# Patient Record
Sex: Male | Born: 1935 | Race: White | Hispanic: No | Marital: Married | Smoking: Former smoker
Health system: Southern US, Community
[De-identification: ages and names within clinical notes are randomized; demographics above are authoritative.]

## PROBLEM LIST (undated history)

## (undated) DIAGNOSIS — I2510 Atherosclerotic heart disease of native coronary artery without angina pectoris: Secondary | ICD-10-CM

## (undated) DIAGNOSIS — I10 Essential (primary) hypertension: Secondary | ICD-10-CM

## (undated) DIAGNOSIS — I739 Peripheral vascular disease, unspecified: Secondary | ICD-10-CM

## (undated) DIAGNOSIS — K219 Gastro-esophageal reflux disease without esophagitis: Secondary | ICD-10-CM

## (undated) DIAGNOSIS — N529 Male erectile dysfunction, unspecified: Secondary | ICD-10-CM

## (undated) DIAGNOSIS — R001 Bradycardia, unspecified: Secondary | ICD-10-CM

## (undated) HISTORY — DX: Gastro-esophageal reflux disease without esophagitis: K21.9

## (undated) HISTORY — DX: Atherosclerotic heart disease of native coronary artery without angina pectoris: I25.10

## (undated) HISTORY — PX: OTHER SURGICAL HISTORY: SHX169

## (undated) HISTORY — DX: Essential (primary) hypertension: I10

## (undated) HISTORY — DX: Peripheral vascular disease, unspecified: I73.9

## (undated) HISTORY — DX: Male erectile dysfunction, unspecified: N52.9

## (undated) HISTORY — DX: Bradycardia, unspecified: R00.1

---

## 2001-08-07 ENCOUNTER — Ambulatory Visit (HOSPITAL_COMMUNITY): Admission: RE | Admit: 2001-08-07 | Discharge: 2001-08-07 | Payer: Self-pay | Admitting: Cardiology

## 2001-08-07 ENCOUNTER — Encounter: Payer: Self-pay | Admitting: Cardiology

## 2004-07-04 ENCOUNTER — Ambulatory Visit: Payer: Self-pay

## 2004-08-09 ENCOUNTER — Ambulatory Visit: Payer: Self-pay | Admitting: Internal Medicine

## 2004-09-04 ENCOUNTER — Ambulatory Visit: Payer: Self-pay | Admitting: Internal Medicine

## 2004-10-16 ENCOUNTER — Ambulatory Visit: Payer: Self-pay | Admitting: Internal Medicine

## 2004-11-22 ENCOUNTER — Ambulatory Visit: Payer: Self-pay | Admitting: Internal Medicine

## 2004-12-07 ENCOUNTER — Ambulatory Visit: Payer: Self-pay | Admitting: Specialist

## 2005-01-01 ENCOUNTER — Ambulatory Visit: Payer: Self-pay | Admitting: Internal Medicine

## 2005-02-13 ENCOUNTER — Ambulatory Visit: Payer: Self-pay | Admitting: Internal Medicine

## 2005-03-21 ENCOUNTER — Ambulatory Visit: Payer: Self-pay | Admitting: Internal Medicine

## 2005-04-24 ENCOUNTER — Ambulatory Visit: Payer: Self-pay | Admitting: Internal Medicine

## 2005-05-25 ENCOUNTER — Ambulatory Visit: Payer: Self-pay | Admitting: Internal Medicine

## 2005-06-27 ENCOUNTER — Ambulatory Visit: Payer: Self-pay | Admitting: Internal Medicine

## 2005-07-30 ENCOUNTER — Ambulatory Visit: Payer: Self-pay | Admitting: Internal Medicine

## 2005-08-30 ENCOUNTER — Ambulatory Visit: Payer: Self-pay | Admitting: Internal Medicine

## 2005-11-01 ENCOUNTER — Ambulatory Visit: Payer: Self-pay | Admitting: Surgery

## 2005-11-07 ENCOUNTER — Ambulatory Visit: Payer: Self-pay | Admitting: Surgery

## 2005-11-14 ENCOUNTER — Ambulatory Visit: Payer: Self-pay | Admitting: Surgery

## 2005-11-21 ENCOUNTER — Ambulatory Visit: Payer: Self-pay | Admitting: Internal Medicine

## 2005-11-28 ENCOUNTER — Ambulatory Visit: Payer: Self-pay | Admitting: Internal Medicine

## 2005-12-04 ENCOUNTER — Ambulatory Visit (HOSPITAL_COMMUNITY): Admission: RE | Admit: 2005-12-04 | Discharge: 2005-12-04 | Payer: Self-pay | Admitting: Internal Medicine

## 2005-12-04 ENCOUNTER — Ambulatory Visit: Payer: Self-pay | Admitting: Internal Medicine

## 2005-12-04 HISTORY — PX: PACEMAKER INSERTION: SHX728

## 2005-12-19 ENCOUNTER — Ambulatory Visit: Payer: Self-pay

## 2006-01-07 ENCOUNTER — Ambulatory Visit: Payer: Self-pay | Admitting: Internal Medicine

## 2006-02-06 ENCOUNTER — Ambulatory Visit: Payer: Self-pay | Admitting: Internal Medicine

## 2006-04-16 ENCOUNTER — Ambulatory Visit: Payer: Self-pay | Admitting: Ophthalmology

## 2006-04-23 ENCOUNTER — Ambulatory Visit: Payer: Self-pay | Admitting: Ophthalmology

## 2006-04-29 ENCOUNTER — Ambulatory Visit: Payer: Self-pay | Admitting: Internal Medicine

## 2006-05-01 ENCOUNTER — Ambulatory Visit: Payer: Self-pay | Admitting: Ophthalmology

## 2006-07-22 ENCOUNTER — Ambulatory Visit: Payer: Self-pay | Admitting: Internal Medicine

## 2006-10-14 ENCOUNTER — Ambulatory Visit: Payer: Self-pay | Admitting: Internal Medicine

## 2007-01-06 ENCOUNTER — Ambulatory Visit: Payer: Self-pay | Admitting: Internal Medicine

## 2007-02-17 ENCOUNTER — Ambulatory Visit: Payer: Self-pay

## 2007-03-11 ENCOUNTER — Ambulatory Visit: Payer: Self-pay | Admitting: Internal Medicine

## 2007-03-31 ENCOUNTER — Ambulatory Visit: Payer: Self-pay | Admitting: Internal Medicine

## 2007-06-23 ENCOUNTER — Ambulatory Visit: Payer: Self-pay | Admitting: Internal Medicine

## 2007-07-03 HISTORY — PX: OTHER SURGICAL HISTORY: SHX169

## 2007-07-24 ENCOUNTER — Ambulatory Visit: Payer: Self-pay | Admitting: Internal Medicine

## 2007-08-12 ENCOUNTER — Ambulatory Visit: Payer: Self-pay | Admitting: Ophthalmology

## 2007-09-11 ENCOUNTER — Ambulatory Visit: Payer: Self-pay

## 2007-10-07 ENCOUNTER — Ambulatory Visit: Payer: Self-pay | Admitting: Ophthalmology

## 2007-10-07 ENCOUNTER — Other Ambulatory Visit: Payer: Self-pay

## 2007-10-14 ENCOUNTER — Ambulatory Visit: Payer: Self-pay | Admitting: Ophthalmology

## 2007-10-23 ENCOUNTER — Ambulatory Visit: Payer: Self-pay | Admitting: Internal Medicine

## 2007-12-15 ENCOUNTER — Ambulatory Visit: Payer: Self-pay | Admitting: Gastroenterology

## 2008-01-22 ENCOUNTER — Ambulatory Visit: Payer: Self-pay | Admitting: Internal Medicine

## 2008-02-17 ENCOUNTER — Other Ambulatory Visit: Payer: Self-pay

## 2008-02-17 ENCOUNTER — Inpatient Hospital Stay: Payer: Self-pay | Admitting: Vascular Surgery

## 2008-02-20 ENCOUNTER — Ambulatory Visit: Payer: Self-pay | Admitting: Internal Medicine

## 2008-04-05 ENCOUNTER — Ambulatory Visit: Payer: Self-pay | Admitting: Internal Medicine

## 2008-04-22 ENCOUNTER — Ambulatory Visit: Payer: Self-pay | Admitting: Internal Medicine

## 2008-04-26 ENCOUNTER — Ambulatory Visit: Payer: Self-pay | Admitting: Gastroenterology

## 2008-07-02 HISTORY — PX: CORONARY ARTERY BYPASS GRAFT: SHX141

## 2008-07-22 ENCOUNTER — Ambulatory Visit: Payer: Self-pay | Admitting: Internal Medicine

## 2008-09-22 ENCOUNTER — Ambulatory Visit: Payer: Self-pay | Admitting: Orthopedic Surgery

## 2008-10-13 ENCOUNTER — Ambulatory Visit: Payer: Self-pay | Admitting: Vascular Surgery

## 2008-10-14 ENCOUNTER — Encounter: Payer: Self-pay | Admitting: Internal Medicine

## 2008-10-21 ENCOUNTER — Ambulatory Visit: Payer: Self-pay | Admitting: Internal Medicine

## 2008-11-08 ENCOUNTER — Ambulatory Visit: Payer: Self-pay | Admitting: Vascular Surgery

## 2008-11-20 DIAGNOSIS — I10 Essential (primary) hypertension: Secondary | ICD-10-CM

## 2008-11-20 DIAGNOSIS — I498 Other specified cardiac arrhythmias: Secondary | ICD-10-CM | POA: Insufficient documentation

## 2008-11-20 DIAGNOSIS — Z87448 Personal history of other diseases of urinary system: Secondary | ICD-10-CM

## 2009-01-20 ENCOUNTER — Ambulatory Visit: Payer: Self-pay | Admitting: Internal Medicine

## 2009-03-09 ENCOUNTER — Ambulatory Visit: Payer: Self-pay | Admitting: Internal Medicine

## 2009-03-14 ENCOUNTER — Ambulatory Visit: Payer: Self-pay | Admitting: Thoracic Surgery (Cardiothoracic Vascular Surgery)

## 2009-03-18 ENCOUNTER — Ambulatory Visit (HOSPITAL_COMMUNITY)
Admission: RE | Admit: 2009-03-18 | Discharge: 2009-03-18 | Payer: Self-pay | Admitting: Thoracic Surgery (Cardiothoracic Vascular Surgery)

## 2009-03-18 ENCOUNTER — Encounter: Payer: Self-pay | Admitting: Thoracic Surgery (Cardiothoracic Vascular Surgery)

## 2009-03-22 ENCOUNTER — Ambulatory Visit: Payer: Self-pay | Admitting: Thoracic Surgery (Cardiothoracic Vascular Surgery)

## 2009-03-22 ENCOUNTER — Inpatient Hospital Stay (HOSPITAL_COMMUNITY)
Admission: RE | Admit: 2009-03-22 | Discharge: 2009-03-30 | Payer: Self-pay | Admitting: Thoracic Surgery (Cardiothoracic Vascular Surgery)

## 2009-04-18 ENCOUNTER — Encounter
Admission: RE | Admit: 2009-04-18 | Discharge: 2009-04-18 | Payer: Self-pay | Admitting: Thoracic Surgery (Cardiothoracic Vascular Surgery)

## 2009-04-18 ENCOUNTER — Ambulatory Visit: Payer: Self-pay | Admitting: Thoracic Surgery (Cardiothoracic Vascular Surgery)

## 2009-04-22 ENCOUNTER — Ambulatory Visit: Payer: Self-pay | Admitting: Internal Medicine

## 2009-06-23 ENCOUNTER — Ambulatory Visit: Payer: Self-pay | Admitting: Internal Medicine

## 2009-07-23 ENCOUNTER — Encounter (INDEPENDENT_AMBULATORY_CARE_PROVIDER_SITE_OTHER): Payer: Self-pay | Admitting: Pediatrics

## 2009-07-23 ENCOUNTER — Ambulatory Visit: Payer: Self-pay | Admitting: Internal Medicine

## 2009-10-24 ENCOUNTER — Ambulatory Visit: Payer: Self-pay | Admitting: Internal Medicine

## 2009-11-14 ENCOUNTER — Ambulatory Visit: Payer: Self-pay | Admitting: Gastroenterology

## 2009-11-22 ENCOUNTER — Ambulatory Visit: Payer: Self-pay | Admitting: Gastroenterology

## 2010-01-23 ENCOUNTER — Ambulatory Visit: Payer: Self-pay | Admitting: Internal Medicine

## 2010-01-27 ENCOUNTER — Encounter (INDEPENDENT_AMBULATORY_CARE_PROVIDER_SITE_OTHER): Payer: Self-pay | Admitting: *Deleted

## 2010-03-13 ENCOUNTER — Encounter: Payer: Self-pay | Admitting: Internal Medicine

## 2010-03-17 ENCOUNTER — Ambulatory Visit: Payer: Self-pay | Admitting: Internal Medicine

## 2010-03-17 DIAGNOSIS — I2510 Atherosclerotic heart disease of native coronary artery without angina pectoris: Secondary | ICD-10-CM | POA: Insufficient documentation

## 2010-03-18 DIAGNOSIS — Z950 Presence of cardiac pacemaker: Secondary | ICD-10-CM

## 2010-06-15 ENCOUNTER — Ambulatory Visit: Payer: Self-pay | Admitting: Internal Medicine

## 2010-06-19 ENCOUNTER — Ambulatory Visit: Payer: Self-pay

## 2010-08-01 NOTE — Letter (Signed)
Summary: Device-Delinquent Check  [facility], Main Office  [address]   [place], [place] [address]   Phone: [phone]  Fax: [phone]     January 27, 2010 MRN: [id number]   Dakota Wood [address] [place], [place]  [address]   Dear Mr. KIMBERLIN,  According to our records, you have not had your implanted device checked in the recommended period of time.  We are unable to determine appropriate device function without checking your device on a regular basis.  Please call our office to schedule an appointment as soon as possible.  If you are having your device checked by another physician, please call us so that we may update our records.  Thank you,   [facility]

## 2010-08-01 NOTE — Letter (Signed)
Summary: [facility]   Imported By: Marylou Mccoy 04/03/2010 13:12:46  _____________________________________________________________________  External Attachment:    Type:   Image     Comment:   External Document

## 2010-08-01 NOTE — Cardiovascular Report (Signed)
Summary: Office Visit   Office Visit   Imported By: Roderic Ovens 03/20/2010 15:37:31  _____________________________________________________________________  External Attachment:    Type:   Image     Comment:   External Document

## 2010-08-01 NOTE — Assessment & Plan Note (Signed)
Summary: pc2/ gd   Visit Type:  Follow-up Primary Provider:  Irving Shows.  CC:  "Doing well"..  History of Present Illness: Dakota Wood returns today for PPM followup.  He is a pleasant elderly man with a h/o CADZ, s/p CABG.  He has HTN and symptomatic bradycardia.  He denies c/p, sob or peripheral edema.  Current Medications (verified): 1)  Aspirin 81 Mg Tbec (Aspirin) .... One Tablet Once Daily 2)  Atenolol 50 Mg Tabs (Atenolol) .... One Tablet Once Daily 3)  Calcium Carbonate and Vitain D .... One Tablet Once Daily 4)  Folic Acid 1 Mg Tabs (Folic Acid) .... Two Tablets Once Daily 5)  Glipizide 5 Mg Tabs (Glipizide) .... One Tablet Two Times A Day 6)  Metformin Hcl 500 Mg Tabs (Metformin Hcl) .... Two Tablets Every A.m. and One Tablet in The P.m. 7)  Plavix 75 Mg Tabs (Clopidogrel Bisulfate) .... One Tablet Once Daily 8)  Protonix 40 Mg Tbec (Pantoprazole Sodium) .... One Tablet Once Daily 9)  Lisinopril 40 Mg Tabs (Lisinopril) .... One Tablet Once Daily 10)  Norvasc 10 Mg Tabs (Amlodipine Besylate) .... 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 PHYSICIAN:  Doylene Canning. Ladona Ridgel, M.D.  DATE OF BIRTH:  12-30-1928  DATE OF PROCEDURE:  12/04/2005  PROCEDURE PERFORMED:  Removal of previously implanted permanent pacemaker,  insertion of a new permanent pacemaker. PVD; s/p bilateral renal stents s/p right carotid endarectomy in 2009.  Review of Systems  The patient denies chest pain, syncope, dyspnea on exertion, and peripheral edema.    Vital Signs:  Patient profile:   77 year old male Height:      67 inches Weight:      195 pounds BMI:     30.65 Pulse rate:   70 / minute BP  sitting:   140 / 64  (left arm) Cuff size:   large  Vitals Entered By: Bishop Dublin, CMA (March 17, 2010 11:41 AM)  Physical Exam  General:  Well developed, well nourished, in no acute distress. Head:  normocephalic and atraumatic Eyes:  PERRLA/EOM intact; conjunctiva and lids normal. Mouth:  Teeth, gums and palate normal. Oral mucosa normal. Neck:  Neck supple, no JVD. No masses, thyromegaly or abnormal cervical nodes. Chest Wall:  no deformities or breast masses noted Lungs:  Clear bilaterally to auscultation with no wheezes, rales, or rhonchi. Heart:  RRR with normal S1 and S2. No murmurs or gallops. Abdomen:  Bowel sounds positive; abdomen soft and non-tender without masses, organomegaly, or hernias noted. No hepatosplenomegaly. Msk:  Back normal, normal gait. Muscle strength and tone normal. Pulses:  pulses normal in all 4 extremities Extremities:  No clubbing or cyanosis. Neurologic:  Alert and oriented x 3.   PPM Specifications Following MD:  Lewayne Bunting, MD     PPM Vendor:  Medtronic     PPM Model Number:  [id number]     PPM Serial Number:  [id number] PPM DOI:  12/04/2005     PPM Implanting MD:  Lewayne Bunting, MD  Lead 1    Location: RA     DOI: 10/26/1991     Model #: [id number]     Serial #: [id number]  Status: active Lead 2    Location: RV     DOI: 10/26/1991     Model #: [id number]     Serial #: [id number]     Status: active  Magnet Response Rate:  BOL 85 ERI 65  Indications:  Sick sinus syndrome   PPM Follow Up Remote Check?  No Battery Voltage:  2.78 V     Battery Est. Longevity:  6.5 years     Pacer Dependent:  No       PPM Device Measurements Atrium  Amplitude: 1.4 mV, Impedance: 467 ohms, Threshold: 1.5 V at 0.4 msec Right Ventricle  Amplitude: 11.20 mV, Impedance: 232 ohms, Threshold: 1.0 V at 0.4 msec  Episodes MS Episodes:  134     Percent Mode Switch:  <0.1%     Ventricular High Rate:  7     Atrial Pacing:  4.9%     Ventricular Pacing:   1.0%  Parameters Mode:  DDD     Lower Rate Limit:  60     Upper Rate Limit:  130 Paced AV Delay:  200     Sensed AV Delay:  200 Next Remote Date:  06/15/2010     Next Cardiology Appt Due:  03/03/2011 Tech Comments:  Ventricular lead impedance chronic @ 232.  Ventricular sensing reprogrammed unipolar.  R-waves bipolar today 2.8-4.61mV.  On initial interrogation there was a high atrial threshold alert 03/16/10.  Atrial threshold today 1.5@0 .4, atrial output programmed 3.0@0 .4 with capture adaptive on @ 2x margin in both the A & V.  134 mode switch episodes the longest 1:56 minutes.  7 VHR episodes the longest 9 seconds.  Carelink transmissions evey 3 months.   ROV 1 year with Dr. Ladona Ridgel in [place]. Altha Harm, LPN  March 17, 2010 12:01 PM  MD Comments:  Agree with above.  Impression & Recommendations:  Problem # 1:  CARDIAC PACEMAKER IN SITU (ICD-V45.01) His PPM is working normally.  Will recheck in several months.  Problem # 2:  CAD (ICD-414.00) He has done well s/p CABG.  No c/p.  Continue meds as below. His updated medication list for this problem includes:    Aspirin 81 Mg Tbec (Aspirin) ..... One tablet once daily    Atenolol 50 Mg Tabs (Atenolol) ..... One tablet once daily    Plavix 75 Mg Tabs (Clopidogrel bisulfate) ..... One tablet once daily    Lisinopril 40 Mg Tabs (Lisinopril) ..... One tablet once daily    Norvasc 10 Mg Tabs (Amlodipine besylate) ..... One tablet two times a day  Problem # 3:  HYPERTENSION (ICD-401.9) His blood pressure is minimally elevated. A low sodium diet is encouraged. Continue meds as below. His updated medication list for this problem includes:    Aspirin 81 Mg Tbec (Aspirin) ..... One tablet once daily    Atenolol 50 Mg Tabs (Atenolol) ..... One tablet once daily    Lisinopril 40 Mg Tabs (Lisinopril) ..... One tablet once daily    Norvasc 10 Mg Tabs (Amlodipine besylate) ..... One tablet two times a day

## 2010-08-01 NOTE — Cardiovascular Report (Signed)
Summary: TTM   TTM   Imported By: Roderic Ovens 08/29/2009 13:30:22  _____________________________________________________________________  External Attachment:    Type:   Image     Comment:   External Document

## 2010-08-01 NOTE — Cardiovascular Report (Signed)
Summary: TTM   TTM   Imported By: Roderic Ovens 01/31/2010 14:38:00  _____________________________________________________________________  External Attachment:    Type:   Image     Comment:   External Document

## 2010-08-01 NOTE — Cardiovascular Report (Signed)
Summary: TTM   TTM   Imported By: Roderic Ovens 11/24/2009 11:30:35  _____________________________________________________________________  External Attachment:    Type:   Image     Comment:   External Document

## 2010-08-03 NOTE — Assessment & Plan Note (Signed)
Summary: mdt ppm   check ra threshold   Current Medications (verified): 1)  Aspirin 81 Mg Tbec (Aspirin) .... One Tablet Once Daily 2)  Atenolol 50 Mg Tabs (Atenolol) .... One Tablet Once Daily 3)  Calcium Carbonate and Vitain D .... One Tablet Once Daily 4)  Folic Acid 1 Mg Tabs (Folic Acid) .... Two Tablets Once Daily 5)  Glipizide 5 Mg Tabs (Glipizide) .... One Tablet Two Times A Day 6)  Metformin Hcl 500 Mg Tabs (Metformin Hcl) .... Two Tablets Every A.m. and One Tablet in The P.m. 7)  Plavix 75 Mg Tabs (Clopidogrel Bisulfate) .... One Tablet Once Daily 8)  Protonix 40 Mg Tbec (Pantoprazole Sodium) .... One Tablet Once Daily 9)  Lisinopril 40 Mg Tabs (Lisinopril) .... One Tablet Once Daily 10)  Norvasc 10 Mg Tabs (Amlodipine Besylate) .... One Tablet Two Times A Day  Allergies: No Known Drug Allergies   PPM Specifications Following MD:  Lewayne Bunting, MD     PPM Vendor:  Medtronic     PPM Model Number:  [id number]     PPM Serial Number:  [id number] PPM DOI:  12/04/2005     PPM Implanting MD:  Lewayne Bunting, MD  Lead 1    Location: RA     DOI: 10/26/1991     Model #: [id number]     Serial #: [id number]     Status: active Lead 2    Location: RV     DOI: 10/26/1991     Model #: [id number]     Serial #: [id number]     Status: active  Magnet Response Rate:  BOL 85 ERI 65  Indications:  Sick sinus syndrome   PPM Follow Up Battery Voltage:  2.77 V     Battery Est. Longevity:  4 yrs     Pacer Dependent:  No       PPM Device Measurements Atrium  Amplitude: 2.00 mV, Impedance: 429 ohms, Threshold: 1.00 V at 1.00 msec Right Ventricle  Amplitude: 15.68 mV, Impedance: 250 ohms, Threshold: 0.750 V at 0.40 msec  Episodes MS Episodes:  2     Percent Mode Switch:  <0.1%     Ventricular High Rate:  0     Atrial Pacing:  3.5%     Ventricular Pacing:  0.2%  Parameters Mode:  DDD     Lower Rate Limit:  60     Upper Rate Limit:  130 Paced AV Delay:  200     Sensed AV Delay:  200 Next Remote Date:   09/21/2010     Next Cardiology Appt Due:  03/05/2011 Tech Comments:  FOLLOWUP FROM [facility] CHECK---MEASURED THRESHOLD HIGH AND CHANGED RA OUTPUT TO 5.00 V.  ON TODAYS CHECK --NORMAL DEVICE FUNCTION.  RA THRESHOLD 1.00 @ 1.00--CHANGED RA OUTPUT FROM 5.00 TO 2.00.  TURNED CAPTURE MANAGEMENT TO MONITOR ONLY.  CHANGED RV OUTPUT FROM 2.00 TO 2.50 V.  CARELINK 09-21-10 AND ROV W/GT IN SEPT 2012. Vella Kohler  June 19, 2010 5:07 PM

## 2010-08-03 NOTE — Cardiovascular Report (Signed)
Summary: Office Visit Remote   Office Visit Remote   Imported By: Roderic Ovens 06/30/2010 16:11:54  _____________________________________________________________________  External Attachment:    Type:   Image     Comment:   External Document

## 2010-08-30 ENCOUNTER — Emergency Department: Payer: Self-pay | Admitting: Emergency Medicine

## 2010-09-21 ENCOUNTER — Ambulatory Visit (INDEPENDENT_AMBULATORY_CARE_PROVIDER_SITE_OTHER): Payer: Medicare Other | Admitting: *Deleted

## 2010-09-21 DIAGNOSIS — I495 Sick sinus syndrome: Secondary | ICD-10-CM

## 2010-09-21 DIAGNOSIS — Z950 Presence of cardiac pacemaker: Secondary | ICD-10-CM

## 2010-09-22 ENCOUNTER — Other Ambulatory Visit: Payer: Self-pay | Admitting: Internal Medicine

## 2010-09-27 NOTE — Progress Notes (Signed)
Remote pacer check  

## 2010-10-01 ENCOUNTER — Encounter: Payer: Self-pay | Admitting: *Deleted

## 2010-10-06 LAB — POCT I-STAT 3, ART BLOOD GAS (G3+)
Acid-base deficit: 3 mmol/L — ABNORMAL HIGH (ref 0.0–2.0)
Acid-base deficit: 5 mmol/L — ABNORMAL HIGH (ref 0.0–2.0)
Bicarbonate: 20.4 mEq/L (ref 20.0–24.0)
Bicarbonate: 22.3 mEq/L (ref 20.0–24.0)
O2 Saturation: 92 %
Patient temperature: 36
Patient temperature: 37.2
TCO2: 21 mmol/L (ref 0–100)
TCO2: 23 mmol/L (ref 0–100)
TCO2: 26 mmol/L (ref 0–100)
pCO2 arterial: 32.2 mmHg — ABNORMAL LOW (ref 35.0–45.0)
pCO2 arterial: 34.6 mmHg — ABNORMAL LOW (ref 35.0–45.0)
pCO2 arterial: 38.4 mmHg (ref 35.0–45.0)
pCO2 arterial: 48.6 mmHg — ABNORMAL HIGH (ref 35.0–45.0)
pH, Arterial: 7.306 — ABNORMAL LOW (ref 7.350–7.450)
pH, Arterial: 7.358 (ref 7.350–7.450)
pH, Arterial: 7.359 (ref 7.350–7.450)
pH, Arterial: 7.371 (ref 7.350–7.450)
pH, Arterial: 7.378 (ref 7.350–7.450)
pH, Arterial: 7.444 (ref 7.350–7.450)
pO2, Arterial: 52 mmHg — ABNORMAL LOW (ref 80.0–100.0)
pO2, Arterial: 61 mmHg — ABNORMAL LOW (ref 80.0–100.0)
pO2, Arterial: 66 mmHg — ABNORMAL LOW (ref 80.0–100.0)
pO2, Arterial: 81 mmHg (ref 80.0–100.0)

## 2010-10-06 LAB — GLUCOSE, CAPILLARY
Glucose-Capillary: 101 mg/dL — ABNORMAL HIGH (ref 70–99)
Glucose-Capillary: 102 mg/dL — ABNORMAL HIGH (ref 70–99)
Glucose-Capillary: 112 mg/dL — ABNORMAL HIGH (ref 70–99)
Glucose-Capillary: 113 mg/dL — ABNORMAL HIGH (ref 70–99)
Glucose-Capillary: 115 mg/dL — ABNORMAL HIGH (ref 70–99)
Glucose-Capillary: 117 mg/dL — ABNORMAL HIGH (ref 70–99)
Glucose-Capillary: 118 mg/dL — ABNORMAL HIGH (ref 70–99)
Glucose-Capillary: 119 mg/dL — ABNORMAL HIGH (ref 70–99)
Glucose-Capillary: 133 mg/dL — ABNORMAL HIGH (ref 70–99)
Glucose-Capillary: 134 mg/dL — ABNORMAL HIGH (ref 70–99)
Glucose-Capillary: 136 mg/dL — ABNORMAL HIGH (ref 70–99)
Glucose-Capillary: 143 mg/dL — ABNORMAL HIGH (ref 70–99)
Glucose-Capillary: 148 mg/dL — ABNORMAL HIGH (ref 70–99)
Glucose-Capillary: 149 mg/dL — ABNORMAL HIGH (ref 70–99)
Glucose-Capillary: 151 mg/dL — ABNORMAL HIGH (ref 70–99)
Glucose-Capillary: 158 mg/dL — ABNORMAL HIGH (ref 70–99)
Glucose-Capillary: 167 mg/dL — ABNORMAL HIGH (ref 70–99)
Glucose-Capillary: 175 mg/dL — ABNORMAL HIGH (ref 70–99)
Glucose-Capillary: 187 mg/dL — ABNORMAL HIGH (ref 70–99)
Glucose-Capillary: 192 mg/dL — ABNORMAL HIGH (ref 70–99)
Glucose-Capillary: 90 mg/dL (ref 70–99)
Glucose-Capillary: 91 mg/dL (ref 70–99)

## 2010-10-06 LAB — CBC
HCT: 23.1 % — ABNORMAL LOW (ref 39.0–52.0)
HCT: 23.4 % — ABNORMAL LOW (ref 39.0–52.0)
HCT: 24.3 % — ABNORMAL LOW (ref 39.0–52.0)
HCT: 24.4 % — ABNORMAL LOW (ref 39.0–52.0)
HCT: 27 % — ABNORMAL LOW (ref 39.0–52.0)
HCT: 29.2 % — ABNORMAL LOW (ref 39.0–52.0)
Hemoglobin: 8 g/dL — ABNORMAL LOW (ref 13.0–17.0)
Hemoglobin: 8 g/dL — ABNORMAL LOW (ref 13.0–17.0)
Hemoglobin: 9.1 g/dL — ABNORMAL LOW (ref 13.0–17.0)
Hemoglobin: 9.4 g/dL — ABNORMAL LOW (ref 13.0–17.0)
MCHC: 33.8 g/dL (ref 30.0–36.0)
MCHC: 33.9 g/dL (ref 30.0–36.0)
MCHC: 34 g/dL (ref 30.0–36.0)
MCHC: 34 g/dL (ref 30.0–36.0)
MCHC: 34.3 g/dL (ref 30.0–36.0)
MCHC: 34.7 g/dL (ref 30.0–36.0)
MCV: 101.1 fL — ABNORMAL HIGH (ref 78.0–100.0)
MCV: 101.3 fL — ABNORMAL HIGH (ref 78.0–100.0)
MCV: 101.9 fL — ABNORMAL HIGH (ref 78.0–100.0)
MCV: 102.3 fL — ABNORMAL HIGH (ref 78.0–100.0)
MCV: 102.4 fL — ABNORMAL HIGH (ref 78.0–100.0)
Platelets: 130 10*3/uL — ABNORMAL LOW (ref 150–400)
Platelets: 141 10*3/uL — ABNORMAL LOW (ref 150–400)
Platelets: 191 10*3/uL (ref 150–400)
Platelets: 194 10*3/uL (ref 150–400)
Platelets: 196 10*3/uL (ref 150–400)
Platelets: 235 10*3/uL (ref 150–400)
Platelets: 256 10*3/uL (ref 150–400)
RBC: 2.24 MIL/uL — ABNORMAL LOW (ref 4.22–5.81)
RBC: 2.29 MIL/uL — ABNORMAL LOW (ref 4.22–5.81)
RBC: 2.43 MIL/uL — ABNORMAL LOW (ref 4.22–5.81)
RBC: 2.71 MIL/uL — ABNORMAL LOW (ref 4.22–5.81)
RBC: 3.94 MIL/uL — ABNORMAL LOW (ref 4.22–5.81)
RDW: 13.5 % (ref 11.5–15.5)
RDW: 13.6 % (ref 11.5–15.5)
RDW: 13.7 % (ref 11.5–15.5)
RDW: 13.9 % (ref 11.5–15.5)
RDW: 14.4 % (ref 11.5–15.5)
WBC: 10.4 10*3/uL (ref 4.0–10.5)
WBC: 20 10*3/uL — ABNORMAL HIGH (ref 4.0–10.5)
WBC: 7.8 10*3/uL (ref 4.0–10.5)
WBC: 8.3 10*3/uL (ref 4.0–10.5)
WBC: 9 10*3/uL (ref 4.0–10.5)
WBC: 9.1 10*3/uL (ref 4.0–10.5)

## 2010-10-06 LAB — BASIC METABOLIC PANEL
BUN: 16 mg/dL (ref 6–23)
BUN: 19 mg/dL (ref 6–23)
BUN: 25 mg/dL — ABNORMAL HIGH (ref 6–23)
BUN: 25 mg/dL — ABNORMAL HIGH (ref 6–23)
CO2: 22 mEq/L (ref 19–32)
CO2: 25 mEq/L (ref 19–32)
CO2: 26 mEq/L (ref 19–32)
CO2: 29 mEq/L (ref 19–32)
Calcium: 7.5 mg/dL — ABNORMAL LOW (ref 8.4–10.5)
Calcium: 7.7 mg/dL — ABNORMAL LOW (ref 8.4–10.5)
Calcium: 7.9 mg/dL — ABNORMAL LOW (ref 8.4–10.5)
Calcium: 8.1 mg/dL — ABNORMAL LOW (ref 8.4–10.5)
Chloride: 101 mEq/L (ref 96–112)
Chloride: 102 mEq/L (ref 96–112)
Chloride: 104 mEq/L (ref 96–112)
Chloride: 99 mEq/L (ref 96–112)
Creatinine, Ser: 1.14 mg/dL (ref 0.4–1.5)
Creatinine, Ser: 1.18 mg/dL (ref 0.4–1.5)
Creatinine, Ser: 1.2 mg/dL (ref 0.4–1.5)
Creatinine, Ser: 1.31 mg/dL (ref 0.4–1.5)
GFR calc Af Amer: >60 mL/min (ref >60)
GFR calc Af Amer: >60 mL/min (ref >60)
GFR calc Af Amer: >60 mL/min (ref >60)
GFR calc Af Amer: >60 mL/min (ref >60)
GFR calc Af Amer: >60 mL/min (ref >60)
GFR calc non Af Amer: 52 mL/min — ABNORMAL LOW (ref >60)
GFR calc non Af Amer: 53 mL/min — ABNORMAL LOW (ref >60)
GFR calc non Af Amer: 59 mL/min — ABNORMAL LOW (ref >60)
GFR calc non Af Amer: 60 mL/min — ABNORMAL LOW (ref >60)
GFR calc non Af Amer: >60 mL/min (ref >60)
Glucose, Bld: 144 mg/dL — ABNORMAL HIGH (ref 70–99)
Glucose, Bld: 93 mg/dL (ref 70–99)
Glucose, Bld: 94 mg/dL (ref 70–99)
Potassium: 4.1 mEq/L (ref 3.5–5.1)
Potassium: 4.3 mEq/L (ref 3.5–5.1)
Potassium: 4.8 mEq/L (ref 3.5–5.1)
Potassium: 5.7 mEq/L — ABNORMAL HIGH (ref 3.5–5.1)
Sodium: 134 mEq/L — ABNORMAL LOW (ref 135–145)
Sodium: 135 mEq/L (ref 135–145)
Sodium: 138 mEq/L (ref 135–145)
Sodium: 139 mEq/L (ref 135–145)

## 2010-10-06 LAB — URINALYSIS, ROUTINE W REFLEX MICROSCOPIC
Glucose, UA: NEGATIVE mg/dL
Hgb urine dipstick: NEGATIVE
Protein, ur: NEGATIVE mg/dL
Specific Gravity, Urine: 1.01 (ref 1.005–1.030)
Urobilinogen, UA: 0.2 mg/dL (ref 0.0–1.0)

## 2010-10-06 LAB — POCT I-STAT 4, (NA,K, GLUC, HGB,HCT)
Glucose, Bld: 108 mg/dL — ABNORMAL HIGH (ref 70–99)
Glucose, Bld: 125 mg/dL — ABNORMAL HIGH (ref 70–99)
Glucose, Bld: 131 mg/dL — ABNORMAL HIGH (ref 70–99)
Glucose, Bld: 147 mg/dL — ABNORMAL HIGH (ref 70–99)
HCT: 22 % — ABNORMAL LOW (ref 39.0–52.0)
HCT: 24 % — ABNORMAL LOW (ref 39.0–52.0)
HCT: 33 % — ABNORMAL LOW (ref 39.0–52.0)
Hemoglobin: 11.2 g/dL — ABNORMAL LOW (ref 13.0–17.0)
Hemoglobin: 7.5 g/dL — CL (ref 13.0–17.0)
Potassium: 4 mEq/L (ref 3.5–5.1)
Potassium: 4.2 mEq/L (ref 3.5–5.1)
Potassium: 4.5 mEq/L (ref 3.5–5.1)
Potassium: 4.5 mEq/L (ref 3.5–5.1)
Potassium: 4.8 mEq/L (ref 3.5–5.1)
Sodium: 134 mEq/L — ABNORMAL LOW (ref 135–145)
Sodium: 139 mEq/L (ref 135–145)

## 2010-10-06 LAB — COMPREHENSIVE METABOLIC PANEL
ALT: 21 U/L (ref 0–53)
AST: 23 U/L (ref 0–37)
Albumin: 4.3 g/dL (ref 3.5–5.2)
CO2: 22 mEq/L (ref 19–32)
Chloride: 105 mEq/L (ref 96–112)
Creatinine, Ser: 1.21 mg/dL (ref 0.4–1.5)
GFR calc Af Amer: >60 mL/min (ref >60)
GFR calc non Af Amer: 58 mL/min — ABNORMAL LOW (ref >60)
Potassium: 4.9 mEq/L (ref 3.5–5.1)
Sodium: 139 mEq/L (ref 135–145)
Total Bilirubin: 0.6 mg/dL (ref 0.3–1.2)

## 2010-10-06 LAB — BLOOD GAS, ARTERIAL
Drawn by: 206361
FIO2: 0.21 %
Patient temperature: 98.6
TCO2: 23.9 mmol/L (ref 0–100)
pCO2 arterial: 37.1 mmHg (ref 35.0–45.0)
pH, Arterial: 7.404 (ref 7.350–7.450)

## 2010-10-06 LAB — TYPE AND SCREEN
ABO/RH(D): O POS
Antibody Screen: POSITIVE
DAT, IgG: NEGATIVE

## 2010-10-06 LAB — POCT I-STAT, CHEM 8
BUN: 16 mg/dL (ref 6–23)
Calcium, Ion: 1.16 mmol/L (ref 1.12–1.32)
Chloride: 103 mEq/L (ref 96–112)
Chloride: 104 mEq/L (ref 96–112)
Creatinine, Ser: 1.1 mg/dL (ref 0.4–1.5)
Creatinine, Ser: 1.2 mg/dL (ref 0.4–1.5)
Glucose, Bld: 90 mg/dL (ref 70–99)
HCT: 26 % — ABNORMAL LOW (ref 39.0–52.0)
Potassium: 4.5 mEq/L (ref 3.5–5.1)
Sodium: 135 mEq/L (ref 135–145)
Sodium: 139 mEq/L (ref 135–145)
TCO2: 20 mmol/L (ref 0–100)

## 2010-10-06 LAB — CREATININE, SERUM: GFR calc Af Amer: >60 mL/min (ref >60)

## 2010-10-06 LAB — CULTURE, RESPIRATORY W GRAM STAIN: Culture: NORMAL

## 2010-10-06 LAB — MAGNESIUM
Magnesium: 1.9 mg/dL (ref 1.5–2.5)
Magnesium: 2.3 mg/dL (ref 1.5–2.5)

## 2010-10-06 LAB — PROTIME-INR
INR: 1.3 (ref 0.00–1.49)
Prothrombin Time: 12.3 seconds (ref 11.6–15.2)

## 2010-10-06 LAB — EXPECTORATED SPUTUM ASSESSMENT W GRAM STAIN, RFLX TO RESP C

## 2010-10-06 LAB — PLATELET COUNT: Platelets: 159 10*3/uL (ref 150–400)

## 2010-10-06 LAB — APTT: aPTT: 32 seconds (ref 24–37)

## 2010-11-10 IMAGING — CR DG CHEST 1V PORT
1 series · 1 of 1 positions shown · non-contrast
Comparison: [HOSPITAL] chest x-ray 03/18/2009.

CLINICAL DATA: Post CABG, CAD, chest tubes.

PORTABLE CHEST - 1 VIEW

[AP]
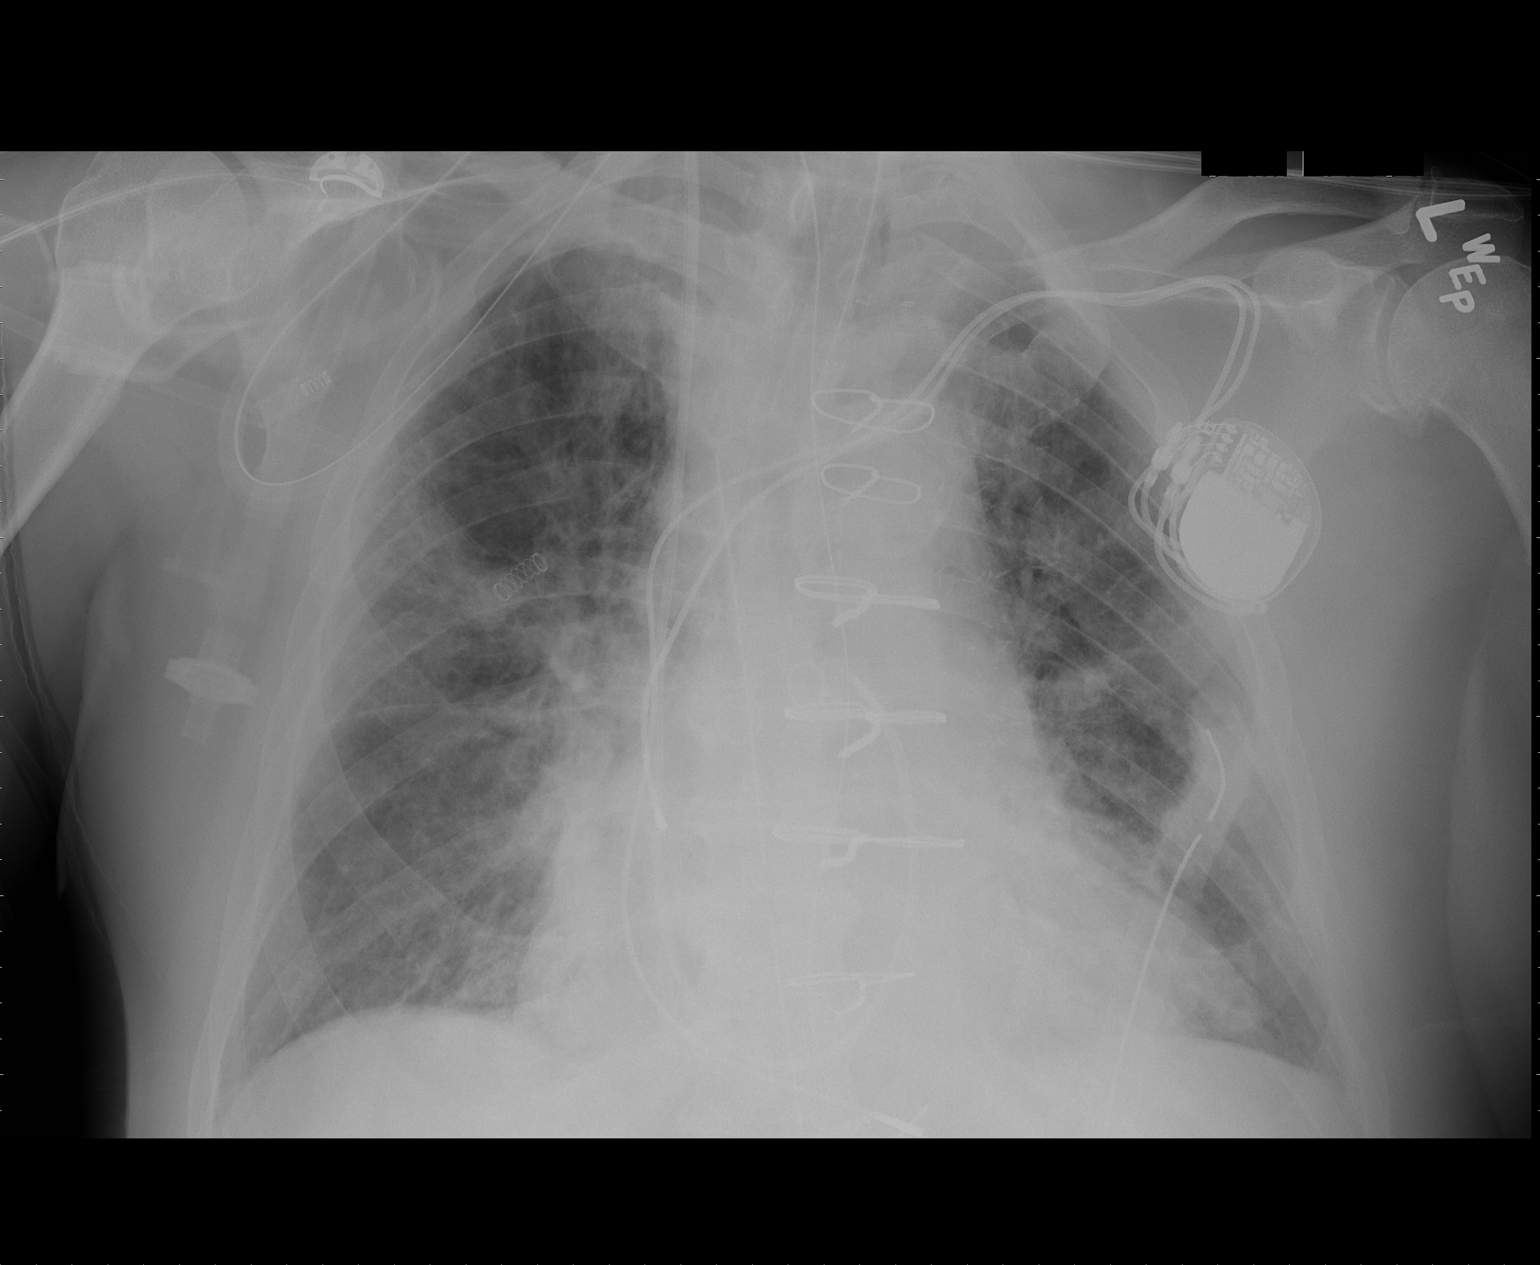

[1 of 1 positions shown; findings below may reference images not displayed]

FINDINGS: Interval post CABG changes, 2 chest tubes near  midline
and 1 at left lower lobe, right internal jugular central venous
catheter ending at the main pulmonary artery level, and
endotracheal tube ending in satisfactory position 4 cm above
carina.  No pneumothorax seen.  Lesser inspiration is seen with
interval diffuse interstitial opacities consistent with
interstitial edema.  Nasogastric tube is seen coursing into the
abdomen.
IMPRESSION: 1.  Interval post CABG change with slight diffuse interstitial
edema.
2.  Satisfactory intubation, and chest tube placements without
pneumothorax.
3.  Nasogastric tube passing and abdomen with end not visualized.
4.  Swan-Ganz catheter ends at the main pulmonary artery level.
5.  Stable position dual lead left-sided permanent cardiac
pacemaker.

## 2010-11-13 IMAGING — CR DG CHEST 1V PORT
1 series · 1 of 1 positions shown · non-contrast
Comparison: Chest 03/24/2009.

CLINICAL DATA: Status post CABG.

PORTABLE CHEST - 1 VIEW

[view not recorded]
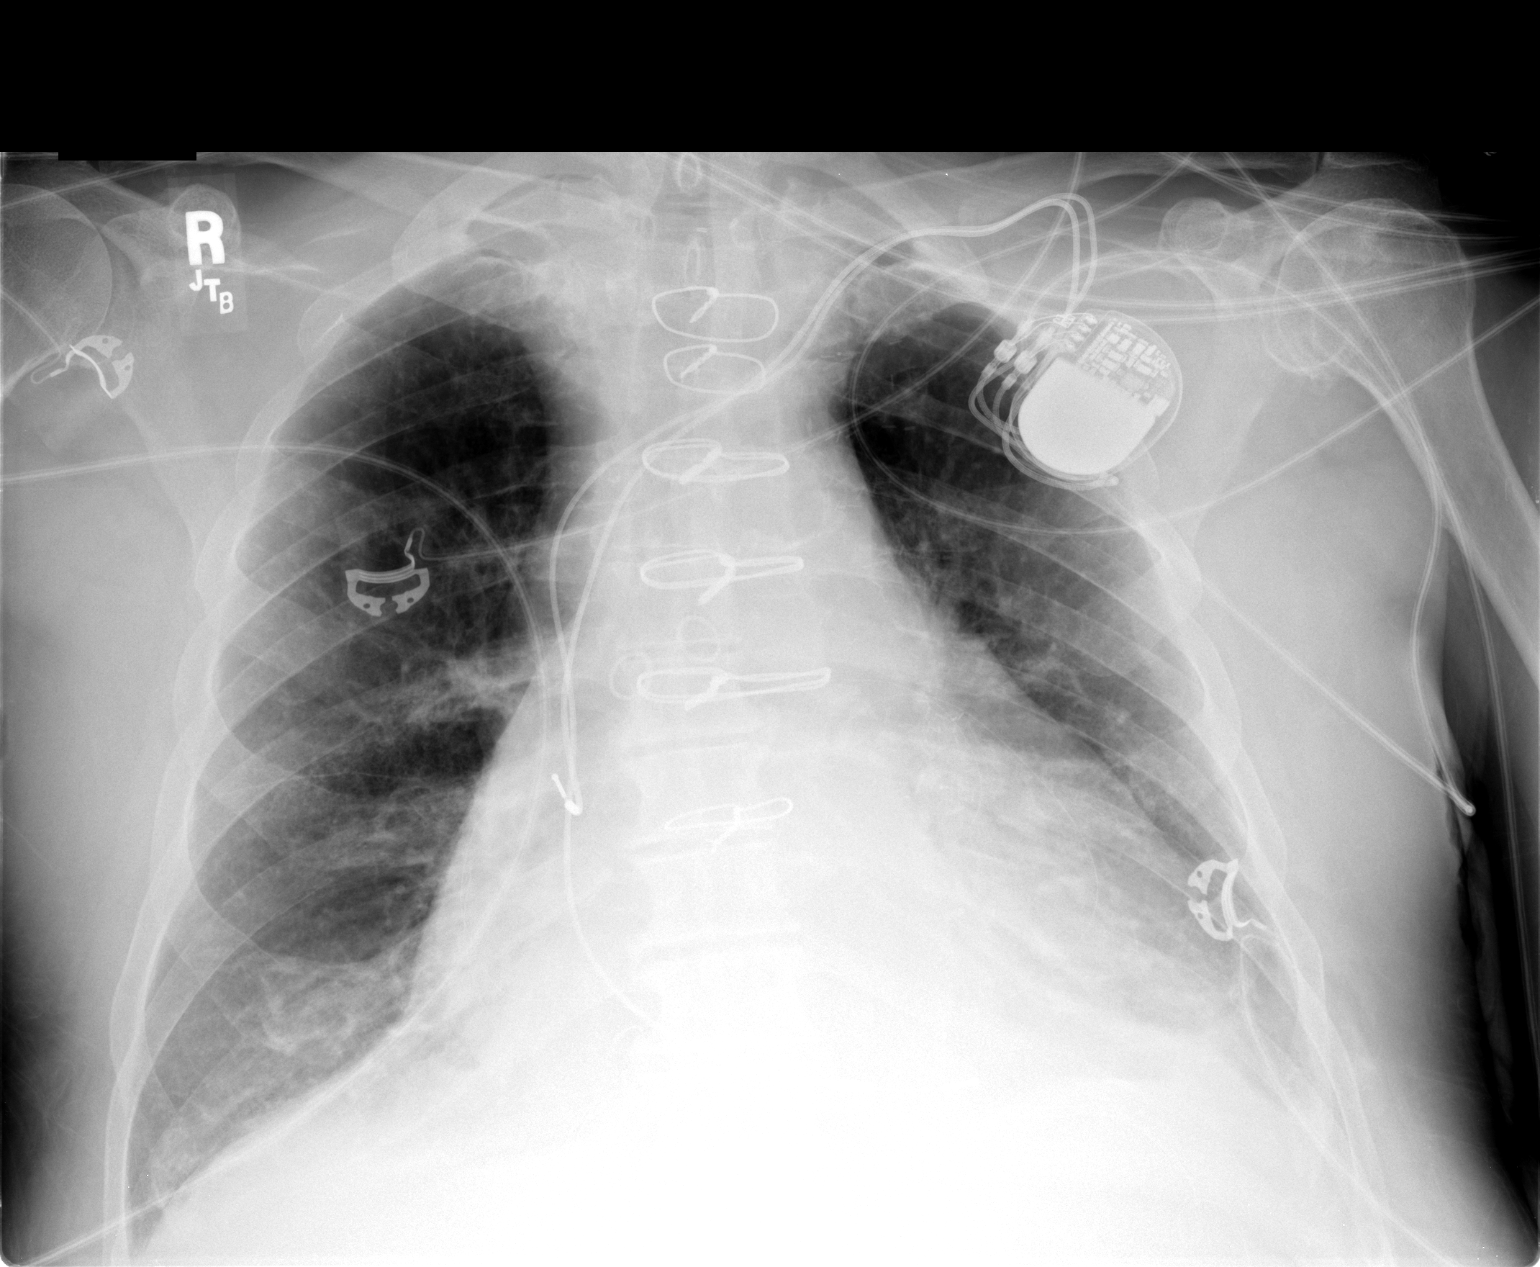

[1 of 1 positions shown; findings below may reference images not displayed]

FINDINGS: The patient's right IJ sheath has been removed.  Right
basilar atelectasis is improved.  Small left effusion basilar
airspace disease are unchanged.  There is cardiomegaly but no
edema.
IMPRESSION: 1.  Improved right basilar atelectasis.  No change in small left
effusion basilar atelectasis.
2.  Right IJ sheath has been removed.

## 2010-11-14 IMAGING — CR DG CHEST 2V
2 series · 2 of 2 positions shown · non-contrast
Comparison: the previous day's study

CLINICAL DATA: Shortness of breath

CHEST - 2 VIEW

[w chest pa]
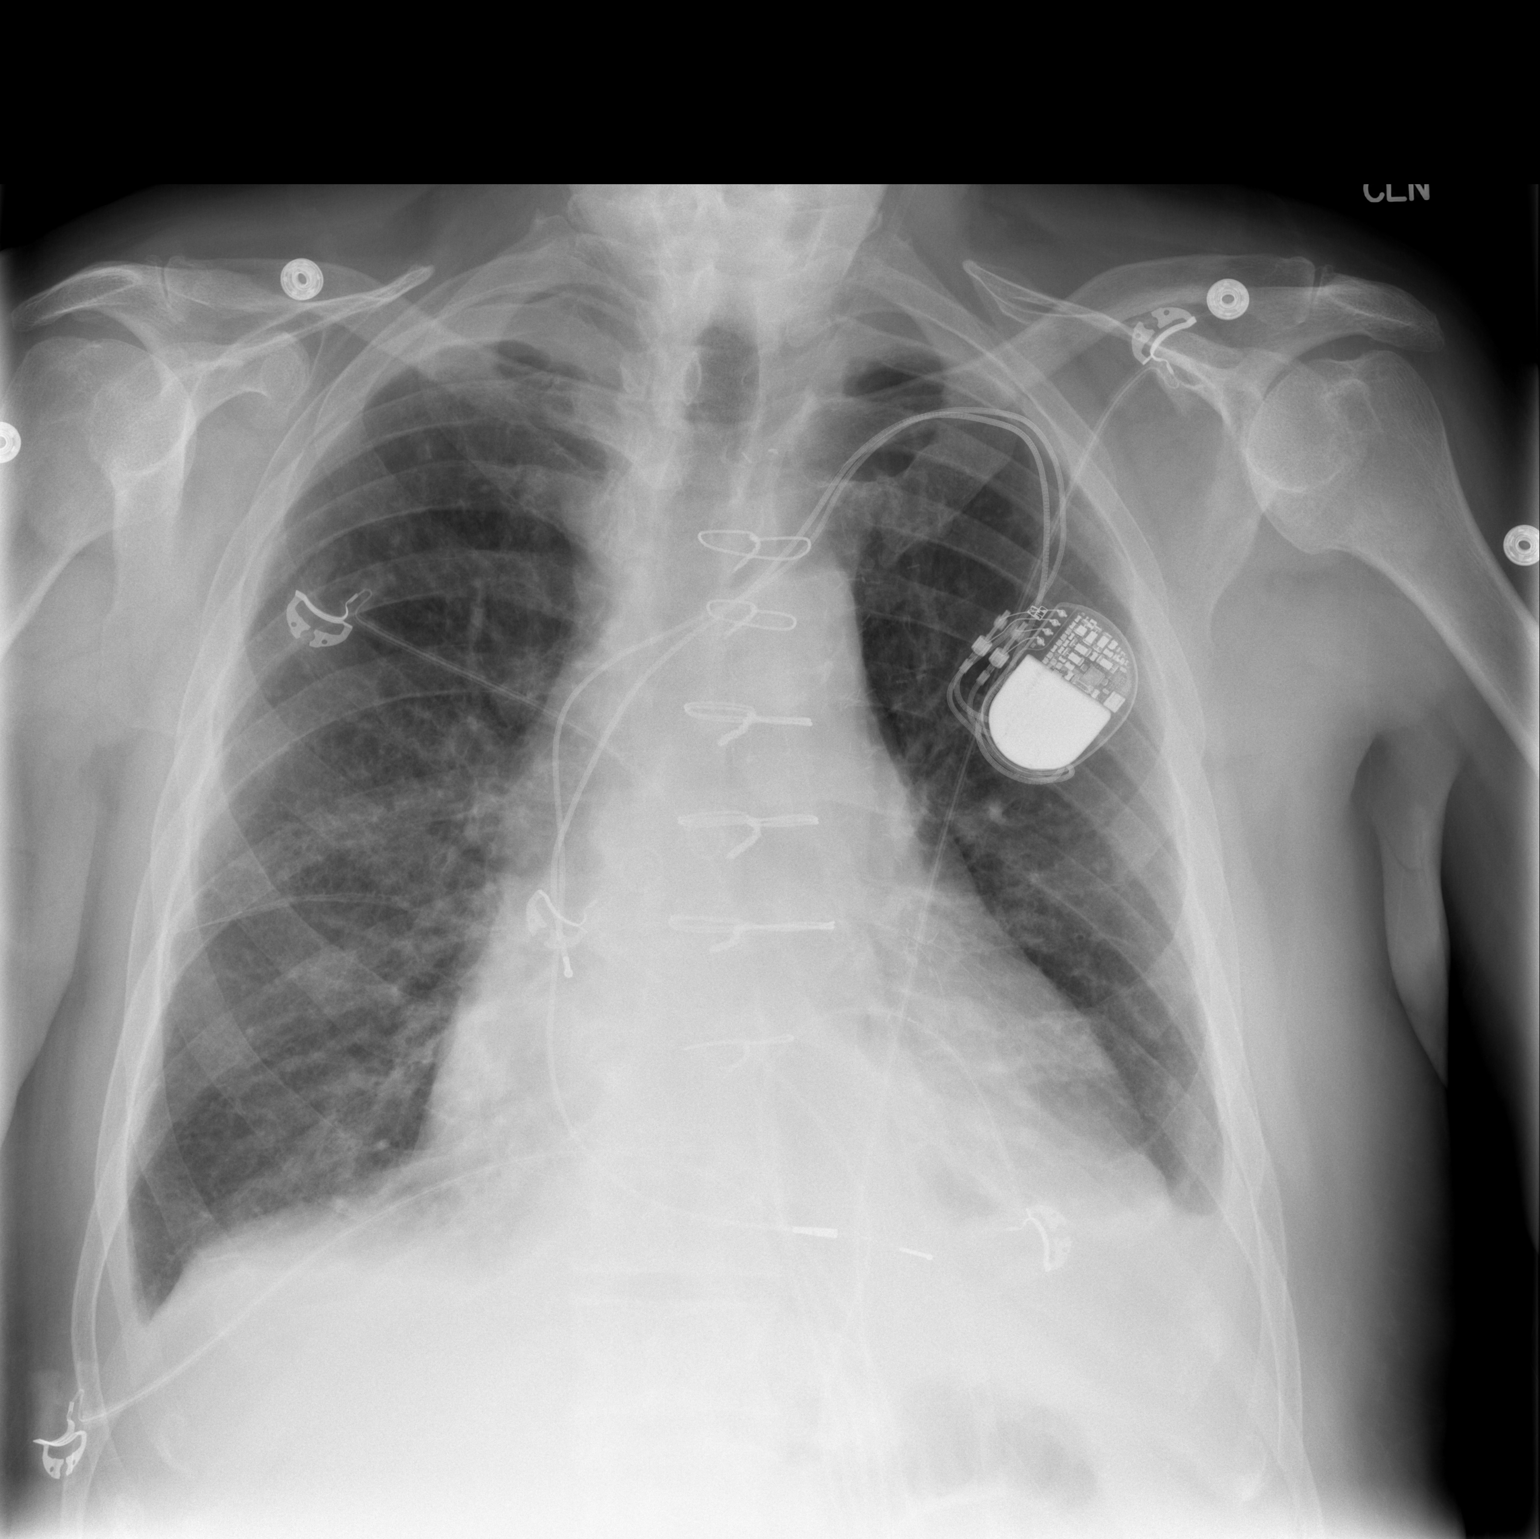

[w chest lat]
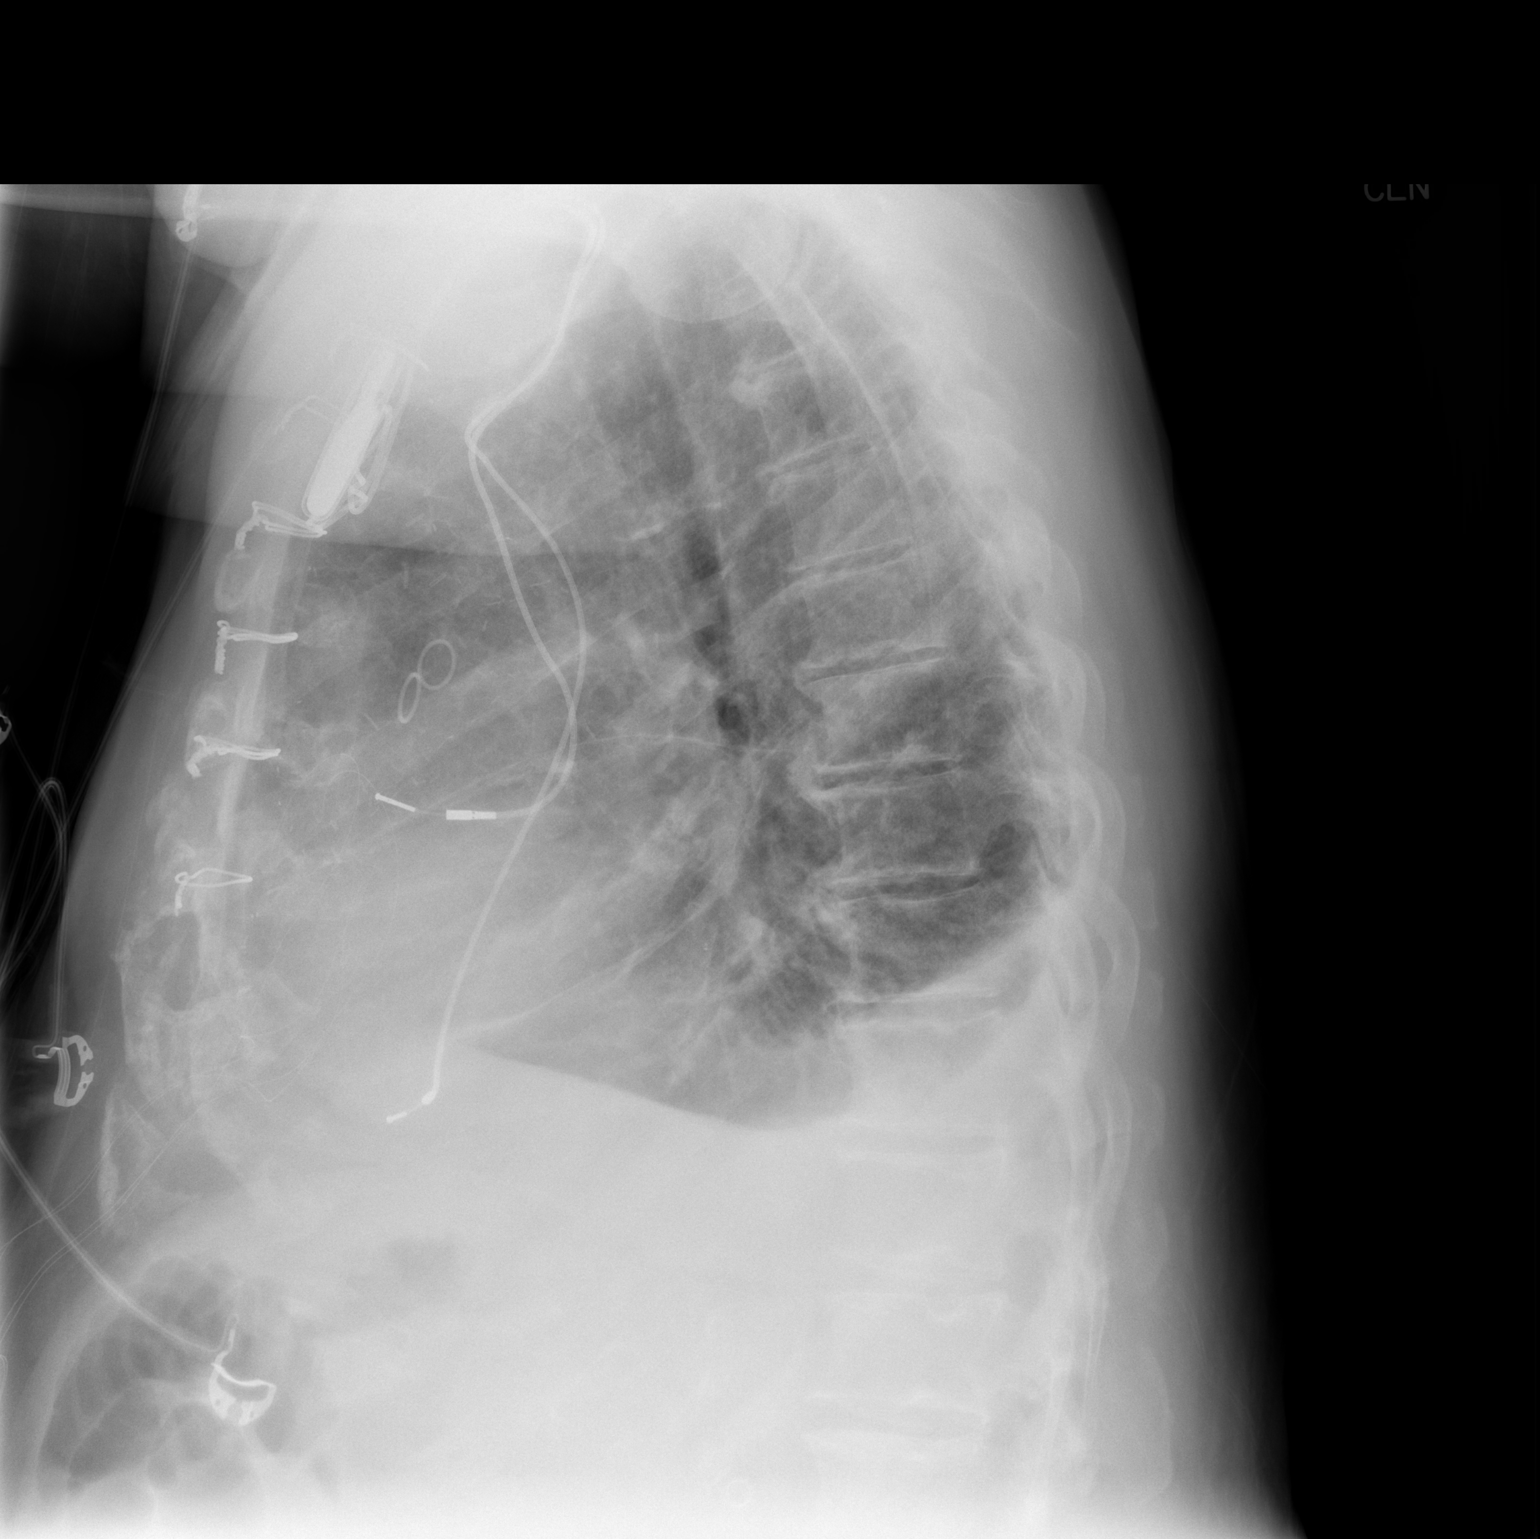

[2 of 2 positions shown; findings below may reference images not displayed]

FINDINGS: Previous CABG.  Left subclavian pacemaker stable
position.  Small bilateral pleural effusions with associated
atelectasis or infiltrate in the lower lobes, left greater than
right.  Mild perihilar and bibasilar interstitial edema with septal
lines seen peripherally at the lung bases.  Mild cardiomegaly.
Atheromatous aortic arch.  Minimal spondylitic changes in the
thoracic spine.
IMPRESSION: 1.  Persistent mild interstitial edema with small pleural
effusions.
2.  Stable cardiomegaly.

## 2010-11-14 NOTE — Assessment & Plan Note (Signed)
[facility]                         ELECTROPHYSIOLOGY OFFICE NOTE   NAME:Dakota Wood, Dakota Wood                    MRN:          [id number]  DATE:03/11/2007                            DOB:          [birth year]    Dakota Wood returns today for follow-up.  He is a very pleasant elderly  man with a history of symptomatic bradycardia status post pacemaker  insertion, also with a longstanding history of hypertension who returns  today for follow-up.  The patient denies chest pain, he denies shortness  of breath.  It has been very stable.   MEDICATIONS:  1. Atenolol 50 mg a day.  2. Norvasc 5 mg a day.  3. Lipitor 40 mg a day.  4. Centrum Silver.  5. Os-Cal.  6. Aspirin.  7. Glipizide.  8. Metformin.  9. Folate.   PHYSICAL EXAMINATION:  GENERAL:  The patient is a pleasant, well  appearing, elderly man in no distress.  VITAL SIGNS:  Blood pressure 152/68, pulse 76 and regular, respirations  16, weight 206 pounds.  NECK:  No jugular venous distention.  LUNGS:  Clear to auscultation bilaterally.  No wheezes, rales, or  rhonchi.  HEART:  Regular rate and rhythm with normal S1 and S2.  There were no  murmurs, rubs, or gallops present.  EXTREMITIES:  No cyanosis, clubbing, or edema.  ABDOMEN:  Benign.   Interrogation of his pacemaker demonstrates a Medtronic [place].  The P  and R waves were greater than 2 and 8, respectively.  The impedance 1000  in the atrium, 293 in the ventricle.  Threshold was 0.5 at 0.4 in the  atrium, 0.875 at 0.4 in the right ventricle.  Battery voltage was 2.79  volts.  He as A-paced 7.8% of the time.   IMPRESSION:  1. Symptomatic bradycardia.  2. Hypertension.  3. Complaints of erectile dysfunction.   DISCUSSION:  With regard to his hypertension, I have asked that he  increase his Norvasc from 5-10 mg daily.  With regard to his erectile  dysfunction, the patient has requested a prescription  for Viagra.  He is not on any  nitrates and so I think it would be  reasonable to give him a prescription for this to see if it helps him at  all.  I will plan to see him back in a year.     Doylene Canning. Ladona Ridgel, MD  Electronically Signed    GWT/MedQ  DD: 03/11/2007  DT: 03/12/2007  Job #: [id number]

## 2010-11-14 NOTE — Assessment & Plan Note (Signed)
OFFICE VISIT   Dakota Wood, Dakota Wood  DOB:  Mar 27, 1929                                        April 18, 2009  CHART #:  [id number]   HISTORY:  The patient is a 77 year old gentleman who is status post  coronary artery bypass grafting x4 on March 22, 2009, by Dr.  Dorris Fetch for severe three-vessel coronary artery disease with  exertional angina.  He is seen on today's date in routine office  followup after discharge from this surgical procedure.  Currently, he  reports that he is having minimal pain as it relates to the surgery.  He  does complain of some chronic hip pain.  He denies significant shortness  of breath, although currently he is on home oxygen.  He states that the  home nurses have been checking him off oxygen and his saturations have  been in the 94-95% range.  He has been doing some small amount of  ambulation inside his home and is building up his endurance gradually.  He denies fevers, chills, or other constitutional symptoms.   DIAGNOSTIC TESTS:  Chest x-ray was obtained on today's date.  It reveals  improvement in a small left effusion as well as improvement in left  lower lobe atelectasis when compared with a film dated March 29, 2009.   PHYSICAL EXAMINATION:  Vital Signs:  Blood pressure is 131/66, pulse 94,  respirations 20, oxygen saturation is 95% on 1 L of oxygen.  General:  A  well-developed elderly male in no acute distress.  Pulmonary:  Clear  breath sounds throughout.  Cardiac:  Regular rate and rhythm.  No  murmurs, gallops, or rubs.  Extremities:  Trace edema.  Incisions are  all healing well without evidence of infection.   ASSESSMENT:  The patient is making adequate and overall good recovery  following his surgical revascularization for severe three-vessel  coronary artery disease.  He is scheduled to see Dr. Amador Cunas on  Thursday of this week.  I have instructed him that if he wishes he can  start driving for  short-time periods in the daylight around town, and to  build up gradually as he feels comfortable.  I have encouraged to  continue his ambulation and followup with cardiology  regarding management of medical risk factors.  We will see him again on  a p.r.n. basis if he has any surgical issues.   Rowe Clack, P.A.-C.   Sherryll Burger  D:  04/18/2009  T:  04/18/2009  Job:  [id number]   cc:   Doylene Canning. Ladona Ridgel, MD  Gordy Savers, MD  Curtis Sites, MD

## 2010-11-14 NOTE — H&P (Signed)
HISTORY AND PHYSICAL EXAMINATION   March 14, 2009   Re:  Dakota Wood, Dakota Wood       DOB:  06-21-1929   REASON FOR CONSULTATION:  Three-vessel coronary disease with new-onset  angina.   HISTORY OF PRESENT ILLNESS:  The patient is a 77 year old gentleman with  a history of known coronary disease.  He had an MI back in 1993, a  complete heart block requiring pacemaker placement at that time,  catheterization of his right coronary but did not have any significant  disease in the left coronary system.  He was treated medically from that  point.  He is recently being evaluated for a hip replacement.  During  the preoperative evaluation he noted that he had been having chest  tightness for the last few weeks, says this is somewhat similar in  nature but much less severe than his MI back in 1993.  It can come on  with either exertion or at rest and has woken him up at night.  He says  it is always resolved within a few minutes with rest.  He has not taken  any nitroglycerin for that.  He had a 2-D echocardiogram done which  showed focal hypokinesis of the inferoposterior walls and posterior  septum with an ejection fraction of 35-40%.  There was no valvular  pathology.  He then underwent cardiac catheterization by Dr. Arnoldo Hooker which showed severe three-vessel disease with a 95% LAD  stenosis, 75% left circumflex stenosis, and total occlusion of his right  coronary.  Inferior apical hypokinesis was again demonstrated and the  ejection fraction appear to be approximately 25-30%.  There was good  motion of the basilar wall segments.   PAST MEDICAL HISTORY:  Significant for coronary artery disease, complete  heart block and permanent pacemaker, peripheral vascular disease,  previous bilateral renal stents, right carotid endarterectomy in 2009,  hypertension, hyperlipidemia, type 2 diabetes mellitus, gastroesophageal  reflux.   CURRENT MEDICATIONS:  Amlodipine 10  mg daily, aspirin 81 mg daily,  atenolol 50 mg daily, calcium 600 plus D 1 tablet daily, folic acid 1 mg  b.i.d., Fosamax 70 mg weekly, furosemide 40 mg daily as needed,  glipizide 5 mg b.i.d., hydrochlorothiazide 25 mg daily, Lipitor 40 mg at  bedtime, lisinopril 40 mg daily, metformin 500 mg b.i.d., MiraLax powder  daily, multivitamin daily, Protonix 40 mg daily, Plavix 75 mg daily,  potassium 10 mEq daily.   ALLERGIES:  Tramadol which causes nausea.   FAMILY HISTORY:  Significant for coronary disease.   SOCIAL HISTORY:  He smoked 2-3 packs a day for 40 years, 100-120 pack-  year history, he quit in 2005.  He does not use alcohol.   REVIEW OF SYSTEMS:  Chest pain, shortness of breath, hip pain, some  swelling in his feet and ankles.  He denies paroxysmal nocturnal dyspnea  or orthopnea.  No history of excessive bleeding or bruising.  No recent  stroke or TIA symptoms.  No change in bowel or bladder habits.   PHYSICAL EXAMINATION:  General:  The patient is a 77 year old white male  in no acute distress.  Vital Signs:  Blood pressure is 134/81, pulse is  76, respirations are 18, his ox saturation is 97% on room air.  Neurologic:  He is alert and oriented x3 with no focal deficits.  HEENT:  Unremarkable.  Neck:  He has a well-healed surgical scar on the left.  There is no thyromegaly, adenopathy, or bruits.  Cardiac:  He has  regular rate and rhythm.  Normal S1 and S2.  No rubs, murmurs, or  gallops.  Lungs:  Clear with equal breath sounds bilaterally although  they are diminished bilaterally.  Abdomen:  Soft, nontender.  Extremities:  Without clubbing or cyanosis.  He does have trace to 1+  edema in both lower extremities.   LABORATORY DATA:  Echocardiogram and cardiac catheterization as  dictated.  EKG shows sinus rhythm with early repolarization.   IMPRESSION:  The patient is a 77 year old gentleman with multiple  cardiac risk factors and a long-standing history of coronary  disease.  He was recently being evaluated for hip replacement.  During that, he  noted that he had been having some chest tightness and echocardiogram  showed left ventricular dysfunction and cardiac catheterization then  showed three-vessel coronary disease with impaired left ventricular  function.  Coronary artery bypass grafting is indicated for survival  benefit as well as relief of the symptoms.   I discussed in detail with the patient and his family the nature and  extent of the operation, incisions to be used, expected recovery time as  well as hospital stay.  We discussed in detail the indications, risks,  benefits, and alternative treatments.  They understand that the risks  include but not limited to death, MI, DVT, PE, stroke, bleeding,  infection, possible need for blood transfusions as well as other organ  system dysfunction including respiratory, renal, or GI complications.  He is at higher risk than normal due to his peripheral vascular disease  including previous carotid endarterectomy and his history of heavy  tobacco abuse as well as his left ventricular dysfunction.  He  understands and accepts these risks and agrees to proceed.  The earliest  available operating date is Tuesday, March 22, 2009.  He would be  admitted on the day of surgery.   I did give him a prescription for sublingual nitroglycerin 0.4 mg and  instructed him on use, may repeat up to 5 times at 5-minute intervals,  and if he still has persistent tightness beyond that he should contacted  EMS immediately.  If he notices any worsening of the frequency or  severity of this pain, he should contact Dr. Gwen Pounds or myself  immediately.    Salvatore Decent Dorris Fetch, M.D.  Electronically Signed   SCH/MEDQ  D:  03/14/2009  T:  03/14/2009  Job:  [id number]   cc:   Curtis Sites, MD  Arnoldo Hooker, MD

## 2010-11-14 NOTE — Progress Notes (Signed)
[facility] OFFICE NOTE   NAME:Cullinane, LEN AZEEZ                    MRN:          [id number]  DATE:04/05/2008                            DOB:          May 14, 1929    Mr. Luhman returns today for followup.  He is a very pleasant elderly  man with a history of hypertension, diabetes, vascular disease, and  symptomatic bradycardia.  He is status post pacemaker insertion.  I have  saw him last a year ago and at that time he was doing very well.  He  subsequently has recently undergone a carotid endarterectomy with the  procedure itself tentatively going well, but the patient notes that over  the week or so he has continued feel weak and tired and have very little  energy.  He is also having trouble of sleeping at night.  He also notes  that his appetite is markedly decreased.  He has gone from 206 pounds in  last year to 182 pounds now.   MEDICATIONS:  1. Today include atenolol 50 mg a day.  2. Lipitor 40 a day.  3. Centrum Silver.  4. Os-Cal 500 t.i.d.  5. Aspirin 81 a day.  6. Glipizide 5 a day.  7. Metformin 500 twice a day.  8. Folate 1 mg daily.  9. Norvasc 10 a day.  10.Lisinopril 40 a day.  11.HCTZ 25 a day.  12.Protonix 40 a day.   PHYSICAL EXAMINATION:  GENERAL:  He is a pleasant elderly man in no  distress.  VITAL SIGNS:  Blood pressure 144/79.  The pulse is 71 and regular.  Respirations were 18.  The weight was 182 pounds.  NECK:  No jugular venous distention.  LUNGS:  Clear bilateral to auscultation.  No wheezes, rales or rhonchi  are present.  CARDIOVASCULAR:  Regular rate and rhythm.  Normal S1 and S2.  No  murmurs, rubs, or gallops present.  ABDOMEN:  Soft and nontender.  EXTREMITIES:  No edema.  His left carotid endarterectomy scar is healing  nicely.   IMPRESSION:  1. Weakness with poor sleep at night, decrease p.o. intake and      fatigue.  2. Symptomatic bradycardia.  3.  Status post pacemaker insertion.   DISCUSSION:  Mr. Sundt is not critically ill, but he certainly is  quite fatigued and his energy is down.  I wonder if there could be some  depressive-type symptoms in play as he is really not sleeping very well  and his appetite is decreased.  He may require additional  gastrointestinal followup.  His pacemaker today is working normally.  He  has a Dispensing optician in place.  The P and R waves were greater than 2,  and greater than 5 respectively.  The impedances were stable.  Thresholds 0.375 at 0.4 in the A and 1.4 in the RV.  Battery voltage was  2.78 volts.  I will plan to see the patient back in the office in 2  months for followup to see how he is doing.  He is instructed to call us  for additional questions or problems.     Doylene Canning.  Ladona Ridgel, MD  Electronically Signed    GWT/MedQ  DD: 04/05/2008  DT: 04/06/2008  Job #: [id number]

## 2010-11-17 NOTE — Op Note (Signed)
NAME:  Dakota Wood, Dakota Wood NO.:  [id number]   MEDICAL RECORD NO.:  [id number]          PATIENT TYPE:  OIB   LOCATION:  2853                         FACILITY:  [facility]   PHYSICIAN:  Doylene Canning. Ladona Ridgel, M.D.  DATE OF BIRTH:  1928-08-09   DATE OF PROCEDURE:  12/04/2005  DATE OF DISCHARGE:                                 OPERATIVE REPORT   PROCEDURE PERFORMED:  Removal of previously implanted permanent pacemaker,  insertion of a new permanent pacemaker.   INDICATIONS:  Symptomatic bradycardia with old device at elective  replacement indication.   INTRODUCTION:  The patient is a very pleasant 77 year old man with a  longstanding history of bradycardia, who underwent permanent pacemaker  insertion  in 1993.  At that time a Medtronic Elite model placed with a  model 4524 atrial lead and a model 4024 ventricular lead.  He has had  chronic decreased ventricular pacing impedances in the past with values  ranging between 250 and 300 ohms.  This has been the case for many years.  The patient is now referred for pacemaker generator change.  As the pacing  impedances have been stable, decision was made not to replace his  ventricular lead.   PROCEDURE:  After informed consent was obtained, the patient is taken to  diagnostic EP lab in fasting state.  After usual preparation and draping,  intravenous fentanyl Midazolam was given for sedation.  30 mL lidocaine was  infiltrated over the old pacemaker insertion site and a 5 cm incision was  carried out over this region.  Electrocautery utilized to dissect down to  the fascial plane.  The pacemaker pocket was entered with electrocautery.  The generator was freed up from its dense fibrous adhesions with  electrocautery.  It was removed with gentle traction.  The ventricular and  atrial leads were disconnected from the old generator and evaluated.  The P-  waves were 5, the R-waves 7, the pacing impedance was 863 ohms in the atrium  250  ohms (bipolar) in the ventricle and 290 ohms (unipolar) in the right  ventricle.  The threshold 0.3 at 0.5 in the atrium 1.2 at 0.5 bipolar in the  right ventricle 0.9 at 0.5 unipolar in the right ventricle.  With these  satisfactory parameters, the new Medtronic [place] SED R01 dual-chamber  pacemaker serial number [id number] was connected to the atrial and  ventricular pacing leads and placed in the subcutaneous pocket.  At this  point, Kanamycin irrigation was utilized to irrigate the pocket.  Electrocautery utilized to assure hemostasis.  The incision was closed with  a layer of 2-0 Vicryl followed by layer of 3-0 Vicryl.  Benzoin was painted  on the skin, Steri-Strips were applied.  A pressure dressing was placed.  The patient was returned to his room in satisfactory condition.   COMPLICATIONS:  There were no immediate procedure complications.   RESULTS:  This demonstrates successful removal of previously implanted  Medtronic pacemaker followed by insertion of a new Medtronic Sensia  dual-  chamber pacemaker with no of the procedure complications.  ______________________________  Doylene Canning. Ladona Ridgel, M.D.    GWT/MEDQ  D:  12/04/2005  T:  12/05/2005  Job:  [id number]   cc:   Dr. Kevan Ny

## 2010-11-17 NOTE — Cardiovascular Report (Signed)
[place]  Patient:    ALEE, KATEN Visit Number: [id number] MRN: [id number]          Service Type: CAT Location: [facility] 2855 [id number] Attending Physician:  Jonelle Sidle Dictated by:   Jonelle Sidle, M.D. [facility] Proc. Date: 08/07/01 Admit Date:  08/07/2001 Discharge Date: 08/07/2001                          Cardiac Catheterization  DATE OF BIRTH: 09/26/1928  PRIMARY CARE PHYSICIAN: Kevan Ny, MD  Corinda Gubler CARDIOLOGIST: Doylene Canning. Ladona Ridgel, M.D.  INDICATIONS: The patient is a 77 year old male with a history of sinus node dysfunction, hypertension, type 2 diabetes mellitus, tobacco abuse, and known coronary artery disease, status post cardiac catheterization at [facility] in 1997, who presents with a history of dyspnea on exertion, intermittent chest discomfort, and apparent claudication. The patient had noninvasive lower extremity arterial studies, which demonstrated no obvious obstructive disease, and an adenosine Cardiolite, which suggested a distal anteroapical defect with preserved left ventricular contraction. Given symptoms, he is referred for left and right heart catheterization with distal aortography.  PROCEDURES PERFORMED: 1. Right heart catheterization. 2. Selective coronary angiography. 3. Left heart catheterization. 4. Left ventriculography. 5. Distal aortography. 6. Selective renal angiography.  ACCESS AND EQUIPMENT: The area about the right femoral artery and vein was anesthetized with 1% lidocaine. A 6 French sheath was placed in the right femoral artery via the modified Seldinger technique and an 8 French sheath was placed in the right femoral vein via the modified Seldinger technique. A 7.5 [place] pulmonary artery balloon-tipped flow-directed catheter was used for right heart catheterization and hemodynamic assessment. Standard 6 [place] preformed JL4 and JR4 catheters were used for selective coronary  angiography. A 6 French angled pigtail catheter was used for left heart catheterization, left ventriculography, and distal aortography. A 6 [place] standard preformed JR4 catheter was used for selective renal angiography and additional image of the ostium of the left common iliac. All exchanges were made over a wire and the patient tolerated the procedure well without obvious complications.  HEMODYNAMICS: Right atrium 9 mmHg. Right ventricle 33/11 mmHg, pulmonary artery 30/15 mmHg, pulmonary capillary wedge pressure 12 mmHg. Left ventricle 155/12 mmHg, aorta 155/64 mmHg. Cardiac output 4.5 L/min. (thermodilution) and 3.8 L/min. (Fick). Cardiac index 2.27 (thermodilution) and 1.9 (Fick). Arterial saturation 94%. Pulmonary artery saturation 68%.  ANGIOGRAPHIC FINDINGS: 1. The left main coronary artery is free of significant flow-limiting    coronary atherosclerosis. 2. The left anterior descending is a medium caliber vessel that provides    three diagonal branches. There is a 30-40% proximal stenosis, 50%    mid vessel stenosis, and a 40% diffuse distal stenosis. 3. The circumflex coronary artery is a medium caliber vessel that provides    a large first obtuse marginal branch and smaller second obtuse marginal    branch. There is a 50% proximal circumflex stenosis noted, as well as a    30% stenosis within a large obtuse marginal branch. Collaterals from the    circumflex system are seen to fill portion of the distal right coronary    artery and posterior descending branch. 4. The right coronary artery is a small vessel that is occluded proximally    and fills by bridging collaterals through the conus system with reasonable    distal flow into the posterior descending and posterolateral branches.  DISTAL AORTOGRAM: Distal aortography reveals mild atherosclerosis, predominately  involving the distal aorta of approximately 20% severity. There is also a 40% stenosis at the takeoff of the left  common iliac artery. Selective renal angiography reveals a 15% ostial right renal artery stenosis and no significant stenosis in the left renal artery.  LEFT VENTRICULOGRAPHY: Left ventriculography shows an ejection fraction estimated at 55-60% with no focal wall motion abnormalities. Significant ectopy was noted throughout and 1+ mitral regurgitation was noted in this setting.  DIAGNOSES: 1. Mild to moderate coronary atherosclerosis as outlined above. The    right coronary artery is occluded proximally, but this is a small    branch and fills distally via bridging collaterals. There are no    major obstructive stenoses in the left coronary system above 40-50%. 2. Mild atherosclerosis involving the distal aorta and iliac system. 3. No evidence of significant renal artery stenosis. 4. Preserved left ventricular contraction estimated at 55-60% with normal    hemodynamics as above. 5. Mitral regurgitation of 1+ noted in the setting of significant ventricular    ectopy.  RECOMMENDATIONS: Would recommend aggressive risk factor reduction and continued medical therapy for coronary atherosclerosis. No evidence of significant pulmonary hypertension to explain dyspnea on exertion. Mitral regurgitation of 1+ is noted in the setting of ectopy and should probably be confirmed by 2-D echocardiography if not already performed. I doubt that this is a significant contributor to the patients symptoms as well. Perhaps pulmonary function testing could be considered. Dictated by:   Jonelle Sidle, M.D. [facility] Attending Physician:  Jonelle Sidle DD:  08/07/01 TD:  08/08/01 Job: [id number]

## 2010-11-17 NOTE — Assessment & Plan Note (Signed)
[facility]                           ELECTROPHYSIOLOGY OFFICE NOTE   NAME:Wood, Dakota WHITELEY                    MRN:          [id number]  DATE:02/06/2006                            DOB:          March 02, 1929    Dakota Wood returns today for followup.  He is a very pleasant elderly man  with a history of bradycardia and is status post pacemaker insertion with  recent generator change.  He was seen by [place] about a month ago and was  complaining of some tenderness and erythema over the pacemaker insertion  site.  He had no fevers or chills.  Otherwise, he had no syncope or near  syncope and was stable.  He returns today for re-evaluation.  He has had no  worsening fevers or chills or drainage from his incision.  This has healed  nicely.  He does note some constipation, otherwise he has been stable.   PHYSICAL EXAMINATION:  GENERAL:  On exam, he is a pleasant, well-appearing  elderly man in no distress.  VITAL SIGNS:  Blood pressure 170/78, pulse 67 and regular, respirations 18.  The weight was 212 pounds.  NECK:  No jugular venous distention.  LUNGS:  Clear bilaterally to auscultation.  There are no rales, rhonchi or  wheezes.  CARDIOVASCULAR:  Regular rate and rhythm with normal S1 and S2.  EXTREMITIES:  No edema.  CHEST WALL:  Pacemaker insertion site was healed nicely.  There was no  tenderness, erythema, or swelling here.   IMPRESSION:  1.  Symptomatic bradycardia.  2.  Status post pacemaker insertion with generator change recently with      resulting small amount of swelling, now resolved.  3.  Sinus bradycardia.  4.  Hypertension.   DISCUSSION:  Overall, Dakota Wood is stable.  He will follow up with his  blood pressure checks with Dr. Dierdre Searles.  I will see him back in nine months or  sooner should he have any problems with his pacemaker insertion site.                                   Doylene Canning. Ladona Ridgel, MD   GWT/MedQ  DD:  02/06/2006  DT:   02/06/2006  Job #:  [id number]   cc:   Arvil Chaco, MD

## 2010-12-15 ENCOUNTER — Encounter: Payer: Self-pay | Admitting: Cardiovascular Disease

## 2010-12-21 ENCOUNTER — Ambulatory Visit (INDEPENDENT_AMBULATORY_CARE_PROVIDER_SITE_OTHER): Payer: Medicare Other | Admitting: *Deleted

## 2010-12-21 ENCOUNTER — Other Ambulatory Visit: Payer: Self-pay | Admitting: Internal Medicine

## 2010-12-21 DIAGNOSIS — I498 Other specified cardiac arrhythmias: Secondary | ICD-10-CM

## 2010-12-21 DIAGNOSIS — Z950 Presence of cardiac pacemaker: Secondary | ICD-10-CM

## 2010-12-27 ENCOUNTER — Encounter: Payer: Self-pay | Admitting: Rheumatology

## 2010-12-31 ENCOUNTER — Encounter: Payer: Self-pay | Admitting: Rheumatology

## 2011-01-02 ENCOUNTER — Encounter: Payer: Self-pay | Admitting: *Deleted

## 2011-01-02 NOTE — Progress Notes (Signed)
Pacer remote check  

## 2011-01-31 ENCOUNTER — Encounter: Payer: Self-pay | Admitting: Rheumatology

## 2011-02-22 ENCOUNTER — Other Ambulatory Visit: Payer: Self-pay | Admitting: Neurosurgery

## 2011-02-22 DIAGNOSIS — M5136 Other intervertebral disc degeneration, lumbar region: Secondary | ICD-10-CM

## 2011-02-22 DIAGNOSIS — M545 Low back pain: Secondary | ICD-10-CM

## 2011-02-27 ENCOUNTER — Ambulatory Visit
Admission: RE | Admit: 2011-02-27 | Discharge: 2011-02-27 | Disposition: A | Discharge status: Home / Self Care | Payer: Medicare Other | Source: Ambulatory Visit | Attending: Neurosurgery | Admitting: Neurosurgery

## 2011-02-27 DIAGNOSIS — M545 Low back pain: Secondary | ICD-10-CM

## 2011-02-27 DIAGNOSIS — M5136 Other intervertebral disc degeneration, lumbar region: Secondary | ICD-10-CM

## 2011-02-27 DIAGNOSIS — M549 Dorsalgia, unspecified: Secondary | ICD-10-CM

## 2011-02-27 MED ORDER — IOHEXOL 180 MG/ML  SOLN
15.0000 mL | Freq: Once | INTRAMUSCULAR | Status: AC | PRN
  Administered 2011-02-27: 15 mL via INTRATHECAL

## 2011-02-27 MED ORDER — DIAZEPAM 2 MG PO TABS: 5.0000 mg | ORAL_TABLET | Freq: Once | ORAL | Status: DC

## 2011-03-13 ENCOUNTER — Encounter: Payer: Self-pay | Admitting: Internal Medicine

## 2011-03-16 ENCOUNTER — Ambulatory Visit (INDEPENDENT_AMBULATORY_CARE_PROVIDER_SITE_OTHER): Payer: Medicare Other | Admitting: Internal Medicine

## 2011-03-16 ENCOUNTER — Encounter: Payer: Self-pay | Admitting: Internal Medicine

## 2011-03-16 DIAGNOSIS — Z01810 Encounter for preprocedural cardiovascular examination: Secondary | ICD-10-CM | POA: Insufficient documentation

## 2011-03-16 DIAGNOSIS — Z950 Presence of cardiac pacemaker: Secondary | ICD-10-CM

## 2011-03-16 DIAGNOSIS — I10 Essential (primary) hypertension: Secondary | ICD-10-CM

## 2011-03-16 DIAGNOSIS — I2510 Atherosclerotic heart disease of native coronary artery without angina pectoris: Secondary | ICD-10-CM

## 2011-03-16 DIAGNOSIS — I498 Other specified cardiac arrhythmias: Secondary | ICD-10-CM

## 2011-03-16 LAB — PACEMAKER DEVICE OBSERVATION
AL IMPEDENCE PM: 460 Ohm
AL THRESHOLD: 1 V
RV LEAD THRESHOLD: 1.25 V

## 2011-03-16 NOTE — Patient Instructions (Signed)
Folow up in 12 months.

## 2011-03-16 NOTE — Assessment & Plan Note (Signed)
He denies anginal symptoms. He will continue his current meds.  

## 2011-03-16 NOTE — Assessment & Plan Note (Signed)
His device is working normally. Will recheck in several months. 

## 2011-03-16 NOTE — Progress Notes (Signed)
HPI Mr. Arscott returns today for followup. He is a pleasant 77 yo man with a h/o symptomatic bradycardia s/p PPM, HTN, CAD, and back pain. He is pending back surgery. He has had no c/p, sob, or peripheral edema. He notes pain in his lower back when he walks. No other complaints. Allergies  Allergen Reactions  . Codeine   . Lipitor (Atorvastatin Calcium)   . Plaquenil (Hydroxychloroquine Sulfate)   . Tramadol      Current Outpatient Prescriptions  Medication Sig Dispense Refill  . amLODipine (NORVASC) 10 MG tablet Take 10 mg by mouth 2 (two) times daily.        Marland Kitchen atenolol (TENORMIN) 50 MG tablet Take 50 mg by mouth daily.        . calcium-vitamin D (OSCAL WITH D) 250-125 MG-UNIT per tablet Take 1 tablet by mouth daily. 250-125?-not specified       . clopidogrel (PLAVIX) 75 MG tablet Take 75 mg by mouth daily.        . folic acid (FOLVITE) 1 MG tablet Take 2 mg by mouth daily.        Marland Kitchen glipiZIDE (GLUCOTROL) 5 MG tablet Take 5 mg by mouth 2 (two) times daily.        Marland Kitchen lisinopril (PRINIVIL,ZESTRIL) 40 MG tablet Take 40 mg by mouth daily.        . metFORMIN (GLUCOPHAGE) 500 MG tablet Take 500 mg by mouth 2 (two) times daily. 2 tablets in am and 1 tablet in pm       . pantoprazole (PROTONIX) 40 MG tablet Take 40 mg by mouth daily.           Past Medical History  Diagnosis Date  . CAD (coronary artery disease)     CABG x4 -2010; [facility]  . Other specified cardiac dysrhythmias     bradycardia  . HTN (hypertension)   . ED (erectile dysfunction)     hx  . PVD (peripheral vascular disease)   . GERD (gastroesophageal reflux disease)     ROS:   All systems reviewed and negative except as noted in the HPI.   Past Surgical History  Procedure Date  . Coronary artery bypass graft 2010    x 4  . Pacemaker removal 12/04/05    new pacemaker inserted. Dr. Ladona Ridgel. Medtronic  . Pvd     s/p bilateral renal stents  . Carotid endarectomy 2009    right     History reviewed. No pertinent  family history.   History   Social History  . Marital Status: Married    Spouse Name: N/A    Number of Children: N/A  . Years of Education: N/A   Occupational History  . Not on file.   Social History Main Topics  . Smoking status: Former Smoker -- 1.0 packs/day for 50 years    Types: Cigarettes    Quit date: 03/16/2003  . Smokeless tobacco: Not on file  . Alcohol Use: No  . Drug Use: No  . Sexually Active:    Other Topics Concern  . Not on file   Social History Narrative  . No narrative on file     BP 142/71  Pulse 83  Ht 5\' 7"  (1.702 m)  Wt 192 lb (87.091 kg)  BMI 30.07 kg/m2  Physical Exam:  Elderly, but Well appearing NAD HEENT: Unremarkable Neck:  No JVD, no thyromegally Lymphatics:  No adenopathy Back:  No CVA tenderness Lungs:  Clear with no wheezes, rales, or  rhonchi. Well healed PPM incision. HEART:  Regular rate rhythm, no murmurs, no rubs, no clicks Abd:  soft, positive bowel sounds, no organomegally, no rebound, no guarding Ext:  2 plus pulses, no edema, no cyanosis, no clubbing Skin:  No rashes no nodules Neuro:  CN II through XII intact, motor grossly intact  EKG NSR. No ischemia. DEVICE  Normal device function.  See PaceArt for details.   Assess/Plan:

## 2011-03-16 NOTE — Assessment & Plan Note (Signed)
He is at low risk for major cardiovascular complications. I have encouraged him to proceed with surgery as he is at acceptable risk.

## 2011-03-22 ENCOUNTER — Encounter (HOSPITAL_COMMUNITY)
Admission: RE | Admit: 2011-03-22 | Discharge: 2011-03-22 | Disposition: A | Discharge status: Home / Self Care | Payer: Medicare Other | Source: Ambulatory Visit | Attending: Neurosurgery | Admitting: Neurosurgery

## 2011-03-22 ENCOUNTER — Other Ambulatory Visit (HOSPITAL_COMMUNITY): Payer: Self-pay | Admitting: Neurosurgery

## 2011-03-22 DIAGNOSIS — M48061 Spinal stenosis, lumbar region without neurogenic claudication: Secondary | ICD-10-CM

## 2011-03-22 LAB — BASIC METABOLIC PANEL
CO2: 25 mEq/L (ref 19–32)
Calcium: 9.3 mg/dL (ref 8.4–10.5)
Creatinine, Ser: 1.17 mg/dL (ref 0.50–1.35)
Glucose, Bld: 167 mg/dL — ABNORMAL HIGH (ref 70–99)

## 2011-03-22 LAB — TYPE AND SCREEN: ABO/RH(D): O POS

## 2011-03-22 LAB — CBC
Hemoglobin: 12.1 g/dL — ABNORMAL LOW (ref 13.0–17.0)
MCH: 30.6 pg (ref 26.0–34.0)
MCV: 93.4 fL (ref 78.0–100.0)
Platelets: 318 10*3/uL (ref 150–400)
RBC: 3.96 MIL/uL — ABNORMAL LOW (ref 4.22–5.81)

## 2011-03-23 ENCOUNTER — Inpatient Hospital Stay (HOSPITAL_COMMUNITY): Payer: Medicare Other

## 2011-03-23 ENCOUNTER — Ambulatory Visit (HOSPITAL_COMMUNITY)
Admission: RE | Admit: 2011-03-23 | Discharge: 2011-03-24 | Disposition: A | Discharge status: Home / Self Care | Payer: Medicare Other | Source: Ambulatory Visit | Attending: Neurosurgery | Admitting: Neurosurgery

## 2011-03-23 DIAGNOSIS — E119 Type 2 diabetes mellitus without complications: Secondary | ICD-10-CM | POA: Insufficient documentation

## 2011-03-23 DIAGNOSIS — M48061 Spinal stenosis, lumbar region without neurogenic claudication: Principal | ICD-10-CM | POA: Insufficient documentation

## 2011-03-23 DIAGNOSIS — I10 Essential (primary) hypertension: Secondary | ICD-10-CM | POA: Insufficient documentation

## 2011-03-23 DIAGNOSIS — Z01812 Encounter for preprocedural laboratory examination: Secondary | ICD-10-CM | POA: Insufficient documentation

## 2011-03-23 DIAGNOSIS — Z01818 Encounter for other preprocedural examination: Secondary | ICD-10-CM | POA: Insufficient documentation

## 2011-03-23 LAB — DIFFERENTIAL
Lymphocytes Relative: 20 % (ref 12–46)
Monocytes Absolute: 1.3 10*3/uL — ABNORMAL HIGH (ref 0.1–1.0)
Monocytes Relative: 11 % (ref 3–12)
Neutro Abs: 8.2 10*3/uL — ABNORMAL HIGH (ref 1.7–7.7)

## 2011-03-23 LAB — GLUCOSE, CAPILLARY

## 2011-03-24 LAB — GLUCOSE, CAPILLARY: Glucose-Capillary: 274 mg/dL — ABNORMAL HIGH (ref 70–99)

## 2011-04-17 NOTE — Op Note (Signed)
  NAME:  Dakota Wood, DOVERSPIKE NO.:  [id number]  MEDICAL RECORD NO.:  [id number]  LOCATION:  2899                         FACILITY:  [facility]  PHYSICIAN:  Kathaleen Maser. Naveen Lorusso, M.D.    DATE OF BIRTH:  July 30, 1928  DATE OF PROCEDURE:  03/23/2011 DATE OF DISCHARGE:                              OPERATIVE REPORT   PREOPERATIVE DIAGNOSIS:  L4-5 stenosis.  POSTOPERATIVE DIAGNOSIS:  L4-5 stenosis.  PROCEDURE:  L4-5 decompressive laminectomy with bilateral L4 and L5 decompressive foraminotomies.  SURGEON:  Kathaleen Maser. Xavia Kniskern, MD.  ASSISTANT:  Reinaldo Meeker, MD.  ANESTHESIA:  General endotracheal.  Mr. Salinger is a 77 year old male with history of back and bilateral lower extremity pain, paresthesias, and weakness consistent with neurogenic claudication.  Workup demonstrates evidence of severe stenosis at L4-5.  The patient presents now for decompressive laminectomy in hopes of improving his symptoms.  DESCRIPTION OF PROCEDURE:  The patient was brought to the operating room and placed on table in supine position.  After adequate level of anesthesia was achieved, the patient was placed prone onto Wilson frame, appropriately padded.  The patient's lumbar region was prepped and draped in sterilely.  A #10-blade was used to make a curvilinear skin incision overlying the L4-5 interspace.  This was carried down sharply in the midline.  Subperiosteal dissection was then performed exposing the lamina and facet joints bilaterally.  Deep self-retaining retractor was placed.  Intraoperative x-ray was taken and the L5-S1 level was confirmed.  Dissection was redirected at one levels cephalad and decompressive laminectomy was then performed using Leksell rongeurs, Kerrison rongeurs, and high-speed drill.  The entire lamina of L4 was removed.  Medial facetectomies of L4 and L5 were performed and superior laminectomy of L5 was performed.  Ligamentum flavum was elevated and resected in a piecemeal  fashion using Kerrison rongeurs.  Underlying thecal sac was then identified.  Wide central decompression was completed and then decompression was then performed by undercutting the lateral gutters and completing foraminotomies along the course of the L4- L5 nerve roots bilaterally.  At this point, a very thorough decompression was achieved.  There is no injury to the thecal sac or nerve roots.  Wound was then irrigated with antibiotic solution. Gelfoam was placed topically for hemostasis and found to be good. Microscope retractor system were removed. Hemostasis of the muscles achieved with electrocautery.  Wound was then closed in layers with Vicryl suture.  Steri-Strips and sterile dressing were applied.  There were no complications.  The patient tolerated the procedure well, and he returns to the recovery room postoperatively.          ______________________________ Kathaleen Maser Toma Arts, M.D.     HAP/MEDQ  D:  03/23/2011  T:  03/23/2011  Job:  [id number]  Electronically Signed by Julio Sicks M.D. on 04/17/2011 11:36:20 AM

## 2011-06-14 ENCOUNTER — Encounter: Payer: Medicare Other | Admitting: *Deleted

## 2011-06-18 ENCOUNTER — Encounter: Payer: Self-pay | Admitting: *Deleted

## 2011-06-22 ENCOUNTER — Other Ambulatory Visit: Payer: Self-pay | Admitting: Internal Medicine

## 2011-06-22 ENCOUNTER — Ambulatory Visit (INDEPENDENT_AMBULATORY_CARE_PROVIDER_SITE_OTHER): Payer: Medicare Other | Admitting: *Deleted

## 2011-06-22 ENCOUNTER — Encounter: Payer: Self-pay | Admitting: Internal Medicine

## 2011-06-22 DIAGNOSIS — I495 Sick sinus syndrome: Secondary | ICD-10-CM

## 2011-06-24 ENCOUNTER — Observation Stay: Payer: Self-pay | Admitting: Internal Medicine

## 2011-06-29 LAB — REMOTE PACEMAKER DEVICE
AL IMPEDENCE PM: 460 Ohm
AL THRESHOLD: 1.75 V
BATTERY VOLTAGE: 2.76 V
RV LEAD AMPLITUDE: 22.4 mv
VENTRICULAR PACING PM: 0

## 2011-06-29 NOTE — Progress Notes (Signed)
PPM remote 

## 2011-07-18 ENCOUNTER — Encounter: Payer: Self-pay | Admitting: *Deleted

## 2011-09-27 ENCOUNTER — Ambulatory Visit (INDEPENDENT_AMBULATORY_CARE_PROVIDER_SITE_OTHER): Payer: Medicare Other | Admitting: *Deleted

## 2011-09-27 ENCOUNTER — Encounter: Payer: Self-pay | Admitting: Internal Medicine

## 2011-09-27 DIAGNOSIS — I498 Other specified cardiac arrhythmias: Secondary | ICD-10-CM

## 2011-09-27 DIAGNOSIS — Z950 Presence of cardiac pacemaker: Secondary | ICD-10-CM

## 2011-10-02 LAB — REMOTE PACEMAKER DEVICE
AL THRESHOLD: 1.375 V
BAMS-0001: 175 {beats}/min
BATTERY VOLTAGE: 2.76 V
RV LEAD AMPLITUDE: 22.4 mv
VENTRICULAR PACING PM: 0

## 2011-10-03 NOTE — Progress Notes (Signed)
Remote pacer remote   

## 2011-10-10 ENCOUNTER — Encounter: Payer: Self-pay | Admitting: *Deleted

## 2012-01-04 ENCOUNTER — Encounter: Payer: Medicare Other | Admitting: *Deleted

## 2012-01-07 ENCOUNTER — Encounter: Payer: Self-pay | Admitting: *Deleted

## 2012-01-11 ENCOUNTER — Encounter: Payer: Self-pay | Admitting: Internal Medicine

## 2012-01-11 ENCOUNTER — Ambulatory Visit (INDEPENDENT_AMBULATORY_CARE_PROVIDER_SITE_OTHER): Payer: Medicare Other | Admitting: *Deleted

## 2012-01-11 DIAGNOSIS — I498 Other specified cardiac arrhythmias: Secondary | ICD-10-CM

## 2012-01-18 LAB — REMOTE PACEMAKER DEVICE
AL AMPLITUDE: 2.8 mv
AL THRESHOLD: 1.5 V
BATTERY VOLTAGE: 2.75 V
RV LEAD AMPLITUDE: 22.4 mv
VENTRICULAR PACING PM: 0

## 2012-02-12 ENCOUNTER — Encounter: Payer: Self-pay | Admitting: *Deleted

## 2012-02-25 ENCOUNTER — Inpatient Hospital Stay: Payer: Self-pay | Admitting: Internal Medicine

## 2012-02-25 LAB — CBC WITH DIFFERENTIAL/PLATELET
Bands: 31 %
Comment - H1-Com1: NORMAL
HGB: 11.8 g/dL — ABNORMAL LOW (ref 13.0–18.0)
Lymphocytes: 11 %
MCV: 98 fL (ref 80–100)
Monocytes: 4 %
Platelet: 223 10*3/uL (ref 150–440)
RBC: 3.68 10*6/uL — ABNORMAL LOW (ref 4.40–5.90)
WBC: 12.4 10*3/uL — ABNORMAL HIGH (ref 3.8–10.6)

## 2012-02-25 LAB — URINALYSIS, COMPLETE
Bilirubin,UR: NEGATIVE
Blood: NEGATIVE
Leukocyte Esterase: NEGATIVE
Ph: 5 (ref 4.5–8.0)
Protein: NEGATIVE
RBC,UR: <1 /HPF (ref 0–5)

## 2012-02-25 LAB — COMPREHENSIVE METABOLIC PANEL
Albumin: 3.3 g/dL — ABNORMAL LOW (ref 3.4–5.0)
Alkaline Phosphatase: 50 U/L (ref 50–136)
Calcium, Total: 8.6 mg/dL (ref 8.5–10.1)
Glucose: 176 mg/dL — ABNORMAL HIGH (ref 65–99)
Osmolality: 285 (ref 275–301)
SGOT(AST): 17 U/L (ref 15–37)
Sodium: 139 mmol/L (ref 136–145)

## 2012-02-25 LAB — CK TOTAL AND CKMB (NOT AT ARMC)
CK, Total: 22 U/L — ABNORMAL LOW (ref 35–232)
CK, Total: 18 U/L — ABNORMAL LOW (ref 35–232)
CK-MB: <0.5 ng/mL — ABNORMAL LOW (ref 0.5–3.6)

## 2012-02-25 LAB — TROPONIN I
Troponin-I: <0.02 ng/mL
Troponin-I: <0.02 ng/mL

## 2012-02-26 LAB — CBC WITH DIFFERENTIAL/PLATELET
Basophil #: 0 10*3/uL (ref 0.0–0.1)
Eosinophil #: 0.1 10*3/uL (ref 0.0–0.7)
HCT: 33 % — ABNORMAL LOW (ref 40.0–52.0)
Lymphocyte #: 1.2 10*3/uL (ref 1.0–3.6)
Lymphocyte %: 11.3 %
MCHC: 34 g/dL (ref 32.0–36.0)
MCV: 98 fL (ref 80–100)
Monocyte %: 5.5 %
Neutrophil #: 8.9 10*3/uL — ABNORMAL HIGH (ref 1.4–6.5)
Platelet: 203 10*3/uL (ref 150–440)
RDW: 15.2 % — ABNORMAL HIGH (ref 11.5–14.5)
WBC: 10.8 10*3/uL — ABNORMAL HIGH (ref 3.8–10.6)

## 2012-02-26 LAB — BASIC METABOLIC PANEL
Chloride: 111 mmol/L — ABNORMAL HIGH (ref 98–107)
Co2: 25 mmol/L (ref 21–32)
Creatinine: 1.04 mg/dL (ref 0.60–1.30)
EGFR (African American): >60
Potassium: 4 mmol/L (ref 3.5–5.1)
Sodium: 142 mmol/L (ref 136–145)

## 2012-02-26 LAB — CK TOTAL AND CKMB (NOT AT ARMC): CK, Total: 23 U/L — ABNORMAL LOW (ref 35–232)

## 2012-02-27 LAB — CBC WITH DIFFERENTIAL/PLATELET
Basophil #: 0 10*3/uL (ref 0.0–0.1)
Basophil %: 0.3 %
Eosinophil %: 2.8 %
HCT: 34 % — ABNORMAL LOW (ref 40.0–52.0)
HGB: 11.5 g/dL — ABNORMAL LOW (ref 13.0–18.0)
Lymphocyte #: 1.4 10*3/uL (ref 1.0–3.6)
Lymphocyte %: 17.3 %
MCH: 33.3 pg (ref 26.0–34.0)
MCHC: 33.8 g/dL (ref 32.0–36.0)
MCV: 99 fL (ref 80–100)
Monocyte %: 7.2 %
Neutrophil %: 72.4 %
Platelet: 204 10*3/uL (ref 150–440)
RDW: 14.9 % — ABNORMAL HIGH (ref 11.5–14.5)

## 2012-02-28 LAB — BASIC METABOLIC PANEL
BUN: 14 mg/dL (ref 7–18)
Calcium, Total: 8.5 mg/dL (ref 8.5–10.1)
EGFR (African American): >60
EGFR (Non-African Amer.): >60
Glucose: 152 mg/dL — ABNORMAL HIGH (ref 65–99)
Osmolality: 281 (ref 275–301)
Sodium: 139 mmol/L (ref 136–145)

## 2012-02-28 LAB — HEMATOCRIT: HCT: 33.4 % — ABNORMAL LOW (ref 40.0–52.0)

## 2012-03-02 LAB — CULTURE, BLOOD (SINGLE)

## 2012-04-16 ENCOUNTER — Encounter: Payer: Self-pay | Admitting: *Deleted

## 2012-04-17 ENCOUNTER — Encounter: Payer: Medicare Other | Admitting: Internal Medicine

## 2012-04-22 ENCOUNTER — Encounter: Payer: Self-pay | Admitting: Internal Medicine

## 2012-04-22 ENCOUNTER — Ambulatory Visit (INDEPENDENT_AMBULATORY_CARE_PROVIDER_SITE_OTHER): Payer: Medicare Other | Admitting: Internal Medicine

## 2012-04-22 VITALS — BP 122/60 | HR 70 | Ht 67.0 in | Wt 194.5 lb

## 2012-04-22 DIAGNOSIS — I2510 Atherosclerotic heart disease of native coronary artery without angina pectoris: Secondary | ICD-10-CM

## 2012-04-22 DIAGNOSIS — I498 Other specified cardiac arrhythmias: Secondary | ICD-10-CM

## 2012-04-22 DIAGNOSIS — Z950 Presence of cardiac pacemaker: Secondary | ICD-10-CM

## 2012-04-22 DIAGNOSIS — I10 Essential (primary) hypertension: Secondary | ICD-10-CM

## 2012-04-22 LAB — PACEMAKER DEVICE OBSERVATION
AL IMPEDENCE PM: 453 Ohm
BATTERY VOLTAGE: 2.75 V
VENTRICULAR PACING PM: 0

## 2012-04-22 NOTE — Progress Notes (Signed)
HPI [name] returns today for followup. He is a very pleasant 78 year old man with a history of symptomatic bradycardia, status post permanent pacemaker insertion. He also has hypertension and diabetes. In the interim, he has done well. He denies chest pain, shortness of breath, or peripheral edema. No syncope. Allergies  Allergen Reactions  . Codeine   . Lipitor (Atorvastatin Calcium)   . Plaquenil (Hydroxychloroquine Sulfate)   . Tramadol      Current Outpatient Prescriptions  Medication Sig Dispense Refill  . amLODipine (NORVASC) 10 MG tablet Take 10 mg by mouth 2 (two) times daily.        Marland Kitchen atenolol (TENORMIN) 50 MG tablet Take 50 mg by mouth daily.        Marland Kitchen atorvastatin (LIPITOR) 20 MG tablet Take 20 mg by mouth daily.      . calcium-vitamin D (OSCAL WITH D) 250-125 MG-UNIT per tablet Take 1 tablet by mouth daily. 250-125?-not specified       . clopidogrel (PLAVIX) 75 MG tablet Take 75 mg by mouth daily.        . folic acid (FOLVITE) 1 MG tablet Take 2 mg by mouth daily.        Marland Kitchen glipiZIDE (GLUCOTROL) 5 MG tablet Take 5 mg by mouth 2 (two) times daily.        Marland Kitchen lisinopril (PRINIVIL,ZESTRIL) 40 MG tablet Take 40 mg by mouth daily.        . metFORMIN (GLUCOPHAGE) 500 MG tablet Take 500 mg by mouth 2 (two) times daily. 2 tablets in am and 1 tablet in pm       . pantoprazole (PROTONIX) 40 MG tablet Take 40 mg by mouth daily.           Past Medical History  Diagnosis Date  . CAD (coronary artery disease)     CABG x4 -2010; [facility]  . Other specified cardiac dysrhythmias     bradycardia  . HTN (hypertension)   . ED (erectile dysfunction)     hx  . PVD (peripheral vascular disease)   . GERD (gastroesophageal reflux disease)     ROS:   All systems reviewed and negative except as noted in the HPI.   Past Surgical History  Procedure Date  . Coronary artery bypass graft 2010    x 4  . Pacemaker removal 12/04/05    new pacemaker inserted. Dr. Ladona Ridgel. Medtronic  . Pvd     s/p  bilateral renal stents  . Carotid endarectomy 2009    right     History reviewed. No pertinent family history.   History   Social History  . Marital Status: Married    Spouse Name: N/A    Number of Children: N/A  . Years of Education: N/A   Occupational History  . Not on file.   Social History Main Topics  . Smoking status: Former Smoker -- 1.0 packs/day for 50 years    Types: Cigarettes    Quit date: 03/16/2003  . Smokeless tobacco: Not on file  . Alcohol Use: No  . Drug Use: No  . Sexually Active:    Other Topics Concern  . Not on file   Social History Narrative  . No narrative on file     BP 122/60  Pulse 70  Ht 5\' 7"  (1.702 m)  Wt 194 lb 8 oz (88.225 kg)  BMI 30.46 kg/m2  Physical Exam:  Well appearing elderly man, NAD HEENT: Unremarkable Neck:  No JVD, no thyromegally Lungs:  Clear with no wheezes, rales, or rhonchi. HEART:  Regular rate rhythm, no murmurs, no rubs, no clicks Abd:  soft, positive bowel sounds, no organomegally, no rebound, no guarding Ext:  2 plus pulses, no edema, no cyanosis, no clubbing Skin:  No rashes no nodules Neuro:  CN II through XII intact, motor grossly intact  EKG - normal sinus rhythm with AV sequential pacing  DEVICE  Normal device function.  See PaceArt for details.   Assess/Plan:

## 2012-04-22 NOTE — Assessment & Plan Note (Signed)
He denies anginal symptoms. I've encouraged the patient to increase his physical activity. 

## 2012-04-22 NOTE — Patient Instructions (Signed)
carelink transmission 07-2012  Your physician wants you to follow-up in: ONE YEAR WITH DR Ladona Ridgel [place] You will receive a reminder letter in the mail two months in advance. If you don't receive a letter, please call our office to schedule the follow-up appointment.

## 2012-04-22 NOTE — Assessment & Plan Note (Signed)
His Medtronic dual-chamber pacemaker is working normally. We'll plan to recheck in several months. 

## 2012-04-22 NOTE — Assessment & Plan Note (Signed)
His blood pressure today is well controlled. He will continue his current medical therapy, and maintain a low-sodium diet. 

## 2012-06-06 ENCOUNTER — Ambulatory Visit: Payer: Self-pay | Admitting: Gastroenterology

## 2012-06-09 LAB — PATHOLOGY REPORT

## 2012-06-11 ENCOUNTER — Ambulatory Visit: Payer: Self-pay | Admitting: Gastroenterology

## 2012-07-28 ENCOUNTER — Encounter: Payer: Self-pay | Admitting: Internal Medicine

## 2012-07-28 ENCOUNTER — Ambulatory Visit (INDEPENDENT_AMBULATORY_CARE_PROVIDER_SITE_OTHER): Payer: Medicare Other | Admitting: *Deleted

## 2012-07-28 DIAGNOSIS — I498 Other specified cardiac arrhythmias: Secondary | ICD-10-CM

## 2012-07-28 DIAGNOSIS — Z950 Presence of cardiac pacemaker: Secondary | ICD-10-CM

## 2012-07-30 LAB — REMOTE PACEMAKER DEVICE
BATTERY VOLTAGE: 2.74 V
BRDY-0002RV: 60 {beats}/min
BRDY-0003RV: 130 {beats}/min
BRDY-0004RV: 130 {beats}/min
RV LEAD AMPLITUDE: 22.4 mv
RV LEAD THRESHOLD: 0.875 V

## 2012-08-21 ENCOUNTER — Encounter: Payer: Self-pay | Admitting: *Deleted

## 2012-10-27 ENCOUNTER — Encounter: Payer: Medicare Other | Admitting: *Deleted

## 2012-10-29 ENCOUNTER — Ambulatory Visit: Payer: Self-pay | Admitting: Internal Medicine

## 2012-11-10 ENCOUNTER — Encounter: Payer: Self-pay | Admitting: *Deleted

## 2012-11-19 ENCOUNTER — Encounter: Payer: Self-pay | Admitting: Internal Medicine

## 2012-11-19 ENCOUNTER — Ambulatory Visit (INDEPENDENT_AMBULATORY_CARE_PROVIDER_SITE_OTHER): Payer: Medicare Other | Admitting: *Deleted

## 2012-11-19 DIAGNOSIS — I498 Other specified cardiac arrhythmias: Secondary | ICD-10-CM

## 2012-11-19 DIAGNOSIS — Z950 Presence of cardiac pacemaker: Secondary | ICD-10-CM

## 2012-11-25 LAB — REMOTE PACEMAKER DEVICE
BATTERY VOLTAGE: 2.74 V
BRDY-0003RV: 130 {beats}/min
RV LEAD AMPLITUDE: 16 mv
RV LEAD THRESHOLD: 1.125 V

## 2012-11-28 ENCOUNTER — Encounter: Payer: Self-pay | Admitting: *Deleted

## 2013-04-16 ENCOUNTER — Encounter: Payer: Self-pay | Admitting: Internal Medicine

## 2013-04-16 ENCOUNTER — Ambulatory Visit (INDEPENDENT_AMBULATORY_CARE_PROVIDER_SITE_OTHER): Payer: Medicare Other | Admitting: Internal Medicine

## 2013-04-16 ENCOUNTER — Encounter (INDEPENDENT_AMBULATORY_CARE_PROVIDER_SITE_OTHER): Payer: Self-pay

## 2013-04-16 VITALS — BP 144/67 | HR 91 | Ht 66.0 in | Wt 188.0 lb

## 2013-04-16 DIAGNOSIS — I2510 Atherosclerotic heart disease of native coronary artery without angina pectoris: Secondary | ICD-10-CM

## 2013-04-16 DIAGNOSIS — I498 Other specified cardiac arrhythmias: Secondary | ICD-10-CM

## 2013-04-16 DIAGNOSIS — I10 Essential (primary) hypertension: Secondary | ICD-10-CM

## 2013-04-16 DIAGNOSIS — Z950 Presence of cardiac pacemaker: Secondary | ICD-10-CM

## 2013-04-16 LAB — PACEMAKER DEVICE OBSERVATION
AL AMPLITUDE: 2 mv
AL IMPEDENCE PM: 474 Ohm
AL THRESHOLD: 1.5 V
RV LEAD THRESHOLD: 1 V

## 2013-04-16 NOTE — Patient Instructions (Signed)
Remote monitoring is used to monitor your Pacemaker of ICD from home. This monitoring reduces the number of office visits required to check your device to one time per year. It allows us to keep an eye on the functioning of your device to ensure it is working properly. You are scheduled for a device check from home on 07/17/2013. You may send your transmission at any time that day. If you have a wireless device, the transmission will be sent automatically. After your physician reviews your transmission, you will receive a postcard with your next transmission date.  Your physician wants you to follow-up in: one year with Dr. Taylor.  You will receive a reminder letter in the mail two months in advance. If you don't receive a letter, please call our office to schedule the follow-up appointment.    

## 2013-04-19 ENCOUNTER — Encounter: Payer: Self-pay | Admitting: Internal Medicine

## 2013-04-19 NOTE — Assessment & Plan Note (Signed)
The patient denies anginal symptoms. He will continue his current medical therapy. 

## 2013-04-19 NOTE — Progress Notes (Signed)
HPI Dakota Wood returns today for followup. He is a very pleasant 79 year old man with a history of symptomatic bradycardia, status post permanent pacemaker insertion. He also has hypertension and diabetes. In the interim, he has done well. He denies chest pain, shortness of breath, or peripheral edema. No syncope. Allergies  Allergen Reactions  . Codeine   . Lipitor [Atorvastatin Calcium]   . Plaquenil [Hydroxychloroquine Sulfate]   . Tramadol      Current Outpatient Prescriptions  Medication Sig Dispense Refill  . amLODipine (NORVASC) 10 MG tablet Take 10 mg by mouth 2 (two) times daily.        Marland Kitchen atenolol (TENORMIN) 50 MG tablet Take 50 mg by mouth daily.        Marland Kitchen atorvastatin (LIPITOR) 20 MG tablet Take 20 mg by mouth daily.      . calcium-vitamin D (OSCAL WITH D) 250-125 MG-UNIT per tablet Take 1 tablet by mouth daily. 250-125?-not specified       . clopidogrel (PLAVIX) 75 MG tablet Take 75 mg by mouth daily.        . folic acid (FOLVITE) 1 MG tablet Take 2 mg by mouth daily.        Marland Kitchen glipiZIDE (GLUCOTROL) 5 MG tablet Take 5 mg by mouth 2 (two) times daily.        Marland Kitchen lisinopril (PRINIVIL,ZESTRIL) 40 MG tablet Take 40 mg by mouth daily.        . metFORMIN (GLUCOPHAGE) 500 MG tablet Take 500 mg by mouth 2 (two) times daily. 2 tablets in am and 1 tablet in pm       . pantoprazole (PROTONIX) 40 MG tablet Take 40 mg by mouth daily.        . simvastatin (ZOCOR) 20 MG tablet Take 20 mg by mouth daily.       No current facility-administered medications for this visit.     Past Medical History  Diagnosis Date  . CAD (coronary artery disease)     CABG x4 -2010; [facility]  . Other specified cardiac dysrhythmias(427.89)     bradycardia  . HTN (hypertension)   . ED (erectile dysfunction)     hx  . PVD (peripheral vascular disease)   . GERD (gastroesophageal reflux disease)     ROS:   All systems reviewed and negative except as noted in the HPI.   Past Surgical History  Procedure  Laterality Date  . Coronary artery bypass graft  2010    x 4  . Pacemaker removal  12/04/05    new pacemaker inserted. Dr. Ladona Ridgel. Medtronic  . Pvd      s/p bilateral renal stents  . Carotid endarectomy  2009    right     No family history on file.   History   Social History  . Marital Status: Married    Spouse Name: N/A    Number of Children: N/A  . Years of Education: N/A   Occupational History  . Not on file.   Social History Main Topics  . Smoking status: Former Smoker -- 1.00 packs/day for 50 years    Types: Cigarettes    Quit date: 03/16/2003  . Smokeless tobacco: Not on file  . Alcohol Use: No  . Drug Use: No  . Sexual Activity:    Other Topics Concern  . Not on file   Social History Narrative  . No narrative on file     BP 144/67  Pulse 91  Ht 5\' 6"  (1.676 m)  Wt 188 lb (85.276 kg)  BMI 30.36 kg/m2  Physical Exam:  Well appearing elderly man, NAD HEENT: Unremarkable Neck:  No JVD, no thyromegally Lungs:  Clear with no wheezes, rales, or rhonchi. HEART:  Regular rate rhythm, no murmurs, no rubs, no clicks Abd:  soft, positive bowel sounds, no organomegally, no rebound, no guarding Ext:  2 plus pulses, no edema, no cyanosis, no clubbing Skin:  No rashes no nodules Neuro:  CN II through XII intact, motor grossly intact  EKG - normal sinus rhythm with AV sequential pacing  DEVICE  Normal device function.  See PaceArt for details.   Assess/Plan:

## 2013-04-19 NOTE — Assessment & Plan Note (Signed)
His Medtronic dual-chamber pacemaker is working normally. We'll plan to recheck in several months. 

## 2013-04-22 ENCOUNTER — Inpatient Hospital Stay: Payer: Self-pay | Admitting: Internal Medicine

## 2013-04-22 LAB — CBC WITH DIFFERENTIAL/PLATELET
Basophil #: 0.1 10*3/uL (ref 0.0–0.1)
Eosinophil #: 0 10*3/uL (ref 0.0–0.7)
Eosinophil %: 0.1 %
HCT: 31.2 % — ABNORMAL LOW (ref 40.0–52.0)
HGB: 10.4 g/dL — ABNORMAL LOW (ref 13.0–18.0)
Lymphocyte %: 30.9 %
MCH: 29.7 pg (ref 26.0–34.0)
MCHC: 33.3 g/dL (ref 32.0–36.0)
Monocyte #: 1 x10 3/mm (ref 0.2–1.0)
Monocyte %: 6.1 %
Neutrophil #: 10.8 10*3/uL — ABNORMAL HIGH (ref 1.4–6.5)
Platelet: 241 10*3/uL (ref 150–440)
RBC: 3.51 10*6/uL — ABNORMAL LOW (ref 4.40–5.90)
RDW: 17.2 % — ABNORMAL HIGH (ref 11.5–14.5)

## 2013-04-22 LAB — CK TOTAL AND CKMB (NOT AT ARMC)
CK, Total: 291 U/L — ABNORMAL HIGH (ref 35–232)
CK-MB: 1.2 ng/mL (ref 0.5–3.6)
CK-MB: 0.9 ng/mL (ref 0.5–3.6)

## 2013-04-22 LAB — COMPREHENSIVE METABOLIC PANEL
Albumin: 2.6 g/dL — ABNORMAL LOW (ref 3.4–5.0)
BUN: 25 mg/dL — ABNORMAL HIGH (ref 7–18)
Chloride: 100 mmol/L (ref 98–107)
Co2: 21 mmol/L (ref 21–32)
EGFR (African American): 46 — ABNORMAL LOW
EGFR (Non-African Amer.): 40 — ABNORMAL LOW
Glucose: 198 mg/dL — ABNORMAL HIGH (ref 65–99)
Potassium: 3.8 mmol/L (ref 3.5–5.1)
SGOT(AST): 39 U/L — ABNORMAL HIGH (ref 15–37)
Sodium: 130 mmol/L — ABNORMAL LOW (ref 136–145)
Total Protein: 6.6 g/dL (ref 6.4–8.2)

## 2013-04-22 LAB — TROPONIN I: Troponin-I: <0.02 ng/mL

## 2013-04-22 LAB — LIPASE, BLOOD: Lipase: 125 U/L (ref 73–393)

## 2013-04-23 LAB — CBC WITH DIFFERENTIAL/PLATELET
Basophil %: 0.1 %
Eosinophil #: 0 10*3/uL (ref 0.0–0.7)
Eosinophil %: 0 %
HCT: 28.6 % — ABNORMAL LOW (ref 40.0–52.0)
Lymphocyte #: 3.4 10*3/uL (ref 1.0–3.6)
MCH: 30.5 pg (ref 26.0–34.0)
MCHC: 34.3 g/dL (ref 32.0–36.0)
MCV: 89 fL (ref 80–100)
Neutrophil #: 7.2 10*3/uL — ABNORMAL HIGH (ref 1.4–6.5)
Neutrophil %: 66.4 %
Platelet: 228 10*3/uL (ref 150–440)
RBC: 3.21 10*6/uL — ABNORMAL LOW (ref 4.40–5.90)
RDW: 17.5 % — ABNORMAL HIGH (ref 11.5–14.5)
WBC: 10.9 10*3/uL — ABNORMAL HIGH (ref 3.8–10.6)

## 2013-04-23 LAB — URINALYSIS, COMPLETE
Blood: NEGATIVE
Glucose,UR: NEGATIVE mg/dL (ref 0–75)
Granular Cast: 6
Hyaline Cast: 3
Leukocyte Esterase: NEGATIVE
Nitrite: NEGATIVE
Ph: 5 (ref 4.5–8.0)
Protein: 30
RBC,UR: <1 /HPF (ref 0–5)
Specific Gravity: 1.017 (ref 1.003–1.030)

## 2013-04-23 LAB — BASIC METABOLIC PANEL
BUN: 32 mg/dL — ABNORMAL HIGH (ref 7–18)
Chloride: 99 mmol/L (ref 98–107)
Co2: 21 mmol/L (ref 21–32)
EGFR (African American): 44 — ABNORMAL LOW
Osmolality: 275 (ref 275–301)

## 2013-04-23 LAB — CK TOTAL AND CKMB (NOT AT ARMC): CK, Total: 103 U/L (ref 35–232)

## 2013-04-23 LAB — TSH: Thyroid Stimulating Horm: 1.54 u[IU]/mL

## 2013-04-23 LAB — TROPONIN I: Troponin-I: <0.02 ng/mL

## 2013-04-23 LAB — PRO B NATRIURETIC PEPTIDE: B-Type Natriuretic Peptide: 6537 pg/mL — ABNORMAL HIGH (ref 0–450)

## 2013-04-24 LAB — BASIC METABOLIC PANEL
Anion Gap: 8 (ref 7–16)
BUN: 33 mg/dL — ABNORMAL HIGH (ref 7–18)
Chloride: 104 mmol/L (ref 98–107)
Co2: 21 mmol/L (ref 21–32)
Creatinine: 1.4 mg/dL — ABNORMAL HIGH (ref 0.60–1.30)
EGFR (Non-African Amer.): 47 — ABNORMAL LOW
Osmolality: 281 (ref 275–301)
Potassium: 4.2 mmol/L (ref 3.5–5.1)
Sodium: 133 mmol/L — ABNORMAL LOW (ref 136–145)

## 2013-04-24 LAB — CBC WITH DIFFERENTIAL/PLATELET
Basophil %: 0 %
Eosinophil #: 0 10*3/uL (ref 0.0–0.7)
Eosinophil %: 0 %
HCT: 26.8 % — ABNORMAL LOW (ref 40.0–52.0)
HGB: 9.1 g/dL — ABNORMAL LOW (ref 13.0–18.0)
Lymphocyte %: 19 %
MCH: 30.7 pg (ref 26.0–34.0)
MCHC: 34.2 g/dL (ref 32.0–36.0)
MCV: 90 fL (ref 80–100)
Monocyte #: 0.3 x10 3/mm (ref 0.2–1.0)
Monocyte %: 3.7 %
Neutrophil %: 77.3 %
Platelet: 235 10*3/uL (ref 150–440)
RBC: 2.98 10*6/uL — ABNORMAL LOW (ref 4.40–5.90)
RDW: 17.7 % — ABNORMAL HIGH (ref 11.5–14.5)
WBC: 9.5 10*3/uL (ref 3.8–10.6)

## 2013-04-25 LAB — BASIC METABOLIC PANEL
Anion Gap: 9 (ref 7–16)
BUN: 32 mg/dL — ABNORMAL HIGH (ref 7–18)
Calcium, Total: 7.9 mg/dL — ABNORMAL LOW (ref 8.5–10.1)
EGFR (African American): 57 — ABNORMAL LOW
Glucose: 133 mg/dL — ABNORMAL HIGH (ref 65–99)
Osmolality: 281 (ref 275–301)
Potassium: 3.9 mmol/L (ref 3.5–5.1)

## 2013-04-25 LAB — CBC WITH DIFFERENTIAL/PLATELET
Basophil #: 0 10*3/uL (ref 0.0–0.1)
Basophil %: 0.1 %
Eosinophil #: 0 10*3/uL (ref 0.0–0.7)
Eosinophil %: 0 %
HCT: 27.8 % — ABNORMAL LOW (ref 40.0–52.0)
HGB: 9.4 g/dL — ABNORMAL LOW (ref 13.0–18.0)
Lymphocyte %: 19.6 %
MCH: 30.2 pg (ref 26.0–34.0)
MCHC: 33.8 g/dL (ref 32.0–36.0)
Monocyte #: 0.8 x10 3/mm (ref 0.2–1.0)
Neutrophil #: 10.1 10*3/uL — ABNORMAL HIGH (ref 1.4–6.5)
Neutrophil %: 74.4 %
RBC: 3.11 10*6/uL — ABNORMAL LOW (ref 4.40–5.90)
RDW: 17.4 % — ABNORMAL HIGH (ref 11.5–14.5)
WBC: 13.5 10*3/uL — ABNORMAL HIGH (ref 3.8–10.6)

## 2013-04-26 LAB — BASIC METABOLIC PANEL
Anion Gap: 7 (ref 7–16)
BUN: 27 mg/dL — ABNORMAL HIGH (ref 7–18)
Calcium, Total: 8.2 mg/dL — ABNORMAL LOW (ref 8.5–10.1)
Chloride: 107 mmol/L (ref 98–107)
Creatinine: 1.17 mg/dL (ref 0.60–1.30)
EGFR (African American): >60
EGFR (Non-African Amer.): 58 — ABNORMAL LOW
Glucose: 185 mg/dL — ABNORMAL HIGH (ref 65–99)
Potassium: 4.2 mmol/L (ref 3.5–5.1)

## 2013-04-26 LAB — CBC WITH DIFFERENTIAL/PLATELET
Basophil %: 0.2 %
Eosinophil #: 0 10*3/uL (ref 0.0–0.7)
Eosinophil %: 0 %
HCT: 30 % — ABNORMAL LOW (ref 40.0–52.0)
HGB: 10.1 g/dL — ABNORMAL LOW (ref 13.0–18.0)
Lymphocyte %: 23.8 %
MCHC: 33.7 g/dL (ref 32.0–36.0)
MCV: 88 fL (ref 80–100)
Monocyte #: 0.6 x10 3/mm (ref 0.2–1.0)
Monocyte %: 4.5 %
Neutrophil %: 71.5 %
Platelet: 321 10*3/uL (ref 150–440)
RBC: 3.4 10*6/uL — ABNORMAL LOW (ref 4.40–5.90)
WBC: 13.2 10*3/uL — ABNORMAL HIGH (ref 3.8–10.6)

## 2013-04-27 LAB — BASIC METABOLIC PANEL
BUN: 25 mg/dL — ABNORMAL HIGH (ref 7–18)
Chloride: 105 mmol/L (ref 98–107)
Co2: 24 mmol/L (ref 21–32)
Creatinine: 1.17 mg/dL (ref 0.60–1.30)
Osmolality: 287 (ref 275–301)
Potassium: 4.1 mmol/L (ref 3.5–5.1)

## 2013-04-27 LAB — CBC WITH DIFFERENTIAL/PLATELET
Eosinophil #: 0 10*3/uL (ref 0.0–0.7)
Eosinophil %: 0.2 %
HCT: 30.5 % — ABNORMAL LOW (ref 40.0–52.0)
HGB: 10.2 g/dL — ABNORMAL LOW (ref 13.0–18.0)
Lymphocyte #: 3.1 10*3/uL (ref 1.0–3.6)
MCHC: 33.5 g/dL (ref 32.0–36.0)
MCV: 89 fL (ref 80–100)
Monocyte %: 3.5 %
Platelet: 368 10*3/uL (ref 150–440)
RBC: 3.42 10*6/uL — ABNORMAL LOW (ref 4.40–5.90)

## 2013-04-27 LAB — CULTURE, BLOOD (SINGLE)

## 2013-04-28 LAB — CBC WITH DIFFERENTIAL/PLATELET
Basophil #: 0 10*3/uL (ref 0.0–0.1)
Eosinophil #: 0 10*3/uL (ref 0.0–0.7)
Eosinophil %: 0.1 %
HCT: 31.6 % — ABNORMAL LOW (ref 40.0–52.0)
Lymphocyte #: 3.2 10*3/uL (ref 1.0–3.6)
Lymphocyte %: 32.6 %
MCH: 30.1 pg (ref 26.0–34.0)
MCV: 89 fL (ref 80–100)
Monocyte #: 0.3 x10 3/mm (ref 0.2–1.0)
Monocyte %: 3.1 %

## 2013-04-28 LAB — BASIC METABOLIC PANEL
BUN: 21 mg/dL — ABNORMAL HIGH (ref 7–18)
Chloride: 104 mmol/L (ref 98–107)
Co2: 27 mmol/L (ref 21–32)
Creatinine: 1.03 mg/dL (ref 0.60–1.30)
EGFR (African American): >60
EGFR (Non-African Amer.): >60
Potassium: 4.3 mmol/L (ref 3.5–5.1)

## 2013-04-30 LAB — URINALYSIS, COMPLETE
Bacteria: NONE SEEN
Bilirubin,UR: NEGATIVE
Blood: NEGATIVE
Glucose,UR: NEGATIVE mg/dL (ref 0–75)
Leukocyte Esterase: NEGATIVE
Ph: 5 (ref 4.5–8.0)
Specific Gravity: 1.024 (ref 1.003–1.030)
Squamous Epithelial: NONE SEEN

## 2013-04-30 LAB — BASIC METABOLIC PANEL
Anion Gap: 3 — ABNORMAL LOW (ref 7–16)
Calcium, Total: 8.6 mg/dL (ref 8.5–10.1)
Creatinine: 1.49 mg/dL — ABNORMAL HIGH (ref 0.60–1.30)
EGFR (Non-African Amer.): 43 — ABNORMAL LOW
Osmolality: 278 (ref 275–301)

## 2013-05-01 LAB — BASIC METABOLIC PANEL
Calcium, Total: 8.3 mg/dL — ABNORMAL LOW (ref 8.5–10.1)
Chloride: 101 mmol/L (ref 98–107)
Co2: 27 mmol/L (ref 21–32)
EGFR (African American): >60
Osmolality: 270 (ref 275–301)

## 2013-07-17 ENCOUNTER — Ambulatory Visit: Payer: Medicare Other | Admitting: *Deleted

## 2013-07-20 ENCOUNTER — Encounter: Payer: Self-pay | Admitting: *Deleted

## 2013-07-21 LAB — MDC_IDC_ENUM_SESS_TYPE_REMOTE
Battery Remaining Longevity: 27 mo
Battery Voltage: 2.73 V
Brady Statistic AP VS Percent: 1 %
Lead Channel Pacing Threshold Amplitude: 1.25 V
Lead Channel Pacing Threshold Pulse Width: 0.4 ms
Lead Channel Setting Pacing Amplitude: 2.5 V
Lead Channel Setting Pacing Pulse Width: 0.4 ms
Lead Channel Setting Sensing Sensitivity: 2.8 mV
MDC IDC MSMT BATTERY IMPEDANCE: 1935 Ohm
MDC IDC MSMT LEADCHNL RA IMPEDANCE VALUE: 466 Ohm
MDC IDC MSMT LEADCHNL RA PACING THRESHOLD AMPLITUDE: 1.75 V
MDC IDC MSMT LEADCHNL RA PACING THRESHOLD PULSEWIDTH: 0.4 ms
MDC IDC MSMT LEADCHNL RA SENSING INTR AMPL: 1 mV
MDC IDC MSMT LEADCHNL RV IMPEDANCE VALUE: 243 Ohm
MDC IDC MSMT LEADCHNL RV SENSING INTR AMPL: 8 mV
MDC IDC SESS DTM: 20150117011908
MDC IDC SET LEADCHNL RA PACING AMPLITUDE: 2.5 V
MDC IDC STAT BRADY AP VP PERCENT: 0 %
MDC IDC STAT BRADY AS VP PERCENT: 5 %
MDC IDC STAT BRADY AS VS PERCENT: 95 %

## 2013-07-28 ENCOUNTER — Encounter: Payer: Self-pay | Admitting: *Deleted

## 2013-08-17 ENCOUNTER — Encounter: Payer: Self-pay | Admitting: Internal Medicine

## 2013-10-19 ENCOUNTER — Encounter: Payer: Medicare Other | Admitting: *Deleted

## 2013-10-26 ENCOUNTER — Encounter: Payer: Self-pay | Admitting: *Deleted

## 2013-12-11 ENCOUNTER — Encounter: Payer: Self-pay | Admitting: Cardiology

## 2013-12-21 ENCOUNTER — Ambulatory Visit (INDEPENDENT_AMBULATORY_CARE_PROVIDER_SITE_OTHER): Payer: Medicare Other | Admitting: *Deleted

## 2013-12-21 DIAGNOSIS — I498 Other specified cardiac arrhythmias: Secondary | ICD-10-CM

## 2013-12-21 DIAGNOSIS — Z950 Presence of cardiac pacemaker: Secondary | ICD-10-CM

## 2013-12-21 NOTE — Progress Notes (Signed)
Remote pacemaker transmission.   

## 2013-12-28 ENCOUNTER — Encounter: Payer: Self-pay | Admitting: *Deleted

## 2013-12-28 LAB — MDC_IDC_ENUM_SESS_TYPE_REMOTE
Battery Voltage: 2.72 V
Brady Statistic AP VS Percent: 0 %
Brady Statistic AS VP Percent: 3 %
Date Time Interrogation Session: 20150622182809
Lead Channel Pacing Threshold Amplitude: 1.25 V
Lead Channel Pacing Threshold Amplitude: 1.75 V
Lead Channel Pacing Threshold Pulse Width: 0.4 ms
Lead Channel Setting Pacing Amplitude: 2.5 V
MDC IDC MSMT BATTERY IMPEDANCE: 2082 Ohm
MDC IDC MSMT BATTERY REMAINING LONGEVITY: 25 mo
MDC IDC MSMT LEADCHNL RA IMPEDANCE VALUE: 485 Ohm
MDC IDC MSMT LEADCHNL RA SENSING INTR AMPL: 2.8 mV
MDC IDC MSMT LEADCHNL RV IMPEDANCE VALUE: 256 Ohm
MDC IDC MSMT LEADCHNL RV PACING THRESHOLD PULSEWIDTH: 0.4 ms
MDC IDC MSMT LEADCHNL RV SENSING INTR AMPL: 22.4 mV
MDC IDC SET LEADCHNL RA PACING AMPLITUDE: 2.5 V
MDC IDC SET LEADCHNL RV PACING PULSEWIDTH: 0.4 ms
MDC IDC SET LEADCHNL RV SENSING SENSITIVITY: 4 mV
MDC IDC STAT BRADY AP VP PERCENT: 0 %
MDC IDC STAT BRADY AS VS PERCENT: 97 %

## 2014-01-06 ENCOUNTER — Encounter: Payer: Self-pay | Admitting: Cardiology

## 2014-01-11 ENCOUNTER — Encounter: Payer: Self-pay | Admitting: Internal Medicine

## 2014-03-03 ENCOUNTER — Ambulatory Visit: Payer: Self-pay | Admitting: Internal Medicine

## 2014-04-22 ENCOUNTER — Other Ambulatory Visit: Payer: Self-pay | Admitting: Internal Medicine

## 2014-04-22 ENCOUNTER — Encounter: Payer: Self-pay | Admitting: Internal Medicine

## 2014-04-22 ENCOUNTER — Ambulatory Visit (INDEPENDENT_AMBULATORY_CARE_PROVIDER_SITE_OTHER): Payer: Medicare Other | Admitting: Internal Medicine

## 2014-04-22 VITALS — BP 156/72 | Ht 66.0 in | Wt 174.4 lb

## 2014-04-22 DIAGNOSIS — R001 Bradycardia, unspecified: Secondary | ICD-10-CM

## 2014-04-22 DIAGNOSIS — Z950 Presence of cardiac pacemaker: Secondary | ICD-10-CM

## 2014-04-22 DIAGNOSIS — I10 Essential (primary) hypertension: Secondary | ICD-10-CM

## 2014-04-22 LAB — MDC_IDC_ENUM_SESS_TYPE_INCLINIC
Battery Remaining Longevity: 20 mo
Battery Voltage: 2.72 V
Lead Channel Pacing Threshold Amplitude: 1.25 V
Lead Channel Pacing Threshold Amplitude: 1.5 V
Lead Channel Pacing Threshold Pulse Width: 0.4 ms
Lead Channel Setting Pacing Amplitude: 2.5 V
Lead Channel Setting Pacing Amplitude: 2.5 V
Lead Channel Setting Pacing Pulse Width: 0.4 ms
Lead Channel Setting Sensing Sensitivity: 4 mV
MDC IDC MSMT BATTERY IMPEDANCE: 2462 Ohm
MDC IDC MSMT LEADCHNL RA IMPEDANCE VALUE: 479 Ohm
MDC IDC MSMT LEADCHNL RA PACING THRESHOLD PULSEWIDTH: 0.4 ms
MDC IDC MSMT LEADCHNL RA SENSING INTR AMPL: 1.4 mV
MDC IDC MSMT LEADCHNL RV IMPEDANCE VALUE: 153 Ohm
MDC IDC MSMT LEADCHNL RV SENSING INTR AMPL: 11.2 mV
MDC IDC SESS DTM: 20151022121617
MDC IDC STAT BRADY AP VP PERCENT: 0 %
MDC IDC STAT BRADY AP VS PERCENT: 0 %
MDC IDC STAT BRADY AS VP PERCENT: 2 %
MDC IDC STAT BRADY AS VS PERCENT: 98 %

## 2014-04-22 NOTE — Assessment & Plan Note (Signed)
His medtronic DDD PM is working normally except he has developed problems pacing and sensing and with impedence in the bipolar configuration in the RV and his pacing and sensing have been changed to unipolar and are working satisfactorily. He has approx 2 years of battery longevity.

## 2014-04-22 NOTE — Progress Notes (Signed)
HPI Dakota Wood returns today for followup. He is a very pleasant 80 year old man with a history of symptomatic bradycaLovell Wood, status post permanent pacemaker insertion. He also has hypertension and diabetes. In the interim, he has done well but has developed progressive weakness and joint pain and has become qute inactive and weak. He denies chest pain, shortness of breath, or peripheral edema. No syncope. Allergies  Allergen Reactions  . Codeine   . Lipitor [Atorvastatin Calcium]   . Plaquenil [Hydroxychloroquine Sulfate]   . Tramadol      Current Outpatient Prescriptions  Medication Sig Dispense Refill  . acetaminophen (RA ACETAMINOPHEN) 650 MG CR tablet Take 1 tablet by mouth 2 (two) times daily.      Marland Kitchen. albuterol (PROAIR HFA) 108 (90 BASE) MCG/ACT inhaler Inhale 2 puffs into the lungs 4 (four) times daily.      Marland Kitchen. amLODipine (NORVASC) 10 MG tablet Take 10 mg by mouth 2 (two) times daily.        Marland Kitchen. atenolol (TENORMIN) 50 MG tablet Take 50 mg by mouth daily.        Marland Kitchen. atorvastatin (LIPITOR) 20 MG tablet Take 20 mg by mouth daily.      . calcium-vitamin D (OSCAL WITH D) 250-125 MG-UNIT per tablet Take 1 tablet by mouth daily. 250-125?-not specified       . Cholecalciferol (VITAMIN D3) 1000 UNITS CAPS Take 1 capsule by mouth daily.      . clopidogrel (PLAVIX) 75 MG tablet Take 75 mg by mouth daily.        . folic acid (FOLVITE) 1 MG tablet Take 2 mg by mouth daily.        Marland Kitchen. glipiZIDE (GLUCOTROL) 5 MG tablet Take 5 mg by mouth 2 (two) times daily.        Marland Kitchen. lisinopril (PRINIVIL,ZESTRIL) 40 MG tablet Take 40 mg by mouth daily.        . metFORMIN (GLUCOPHAGE) 500 MG tablet Take 500 mg by mouth 2 (two) times daily.       . Multiple Vitamins tablet Take 1 tablet by mouth daily.      . pantoprazole (PROTONIX) 40 MG tablet Take 40 mg by mouth daily.        . simvastatin (ZOCOR) 20 MG tablet Take 20 mg by mouth daily.      . traZODone (DESYREL) 50 MG tablet Take 1 tablet by mouth at bedtime as needed.  sleep      . Umeclidinium-Vilanterol 62.5-25 MCG/INH AEPB Inhale 2 puffs into the lungs daily as needed. Shortness of breath or wheezing       No current facility-administered medications for this visit.     Past Medical History  Diagnosis Date  . CAD (coronary artery disease)     CABG x4 -2010; [facility]  . Bradycardia   . HTN (hypertension)   . ED (erectile dysfunction)     hx  . PVD (peripheral vascular disease)   . GERD (gastroesophageal reflux disease)     ROS:   All systems reviewed and negative except as noted in the HPI.   Past Surgical History  Procedure Laterality Date  . Coronary artery bypass graft  2010    x 4  . Pacemaker insertion  12/04/05    MDT Jana HalfSensia dual chamber pacemaker implanted by Dr Ladona Ridgelaylor for bradycardia  . Pvd      s/p bilateral renal stents  . Carotid endarectomy  2009    right     No family history on  file.   History   Social History  . Marital Status: Married    Spouse Name: N/A    Number of Children: N/A  . Years of Education: N/A   Occupational History  . Not on file.   Social History Main Topics  . Smoking status: Former Smoker -- 1.00 packs/day for 50 years    Types: Cigarettes    Quit date: 03/16/2003  . Smokeless tobacco: Not on file  . Alcohol Use: No  . Drug Use: No  . Sexual Activity:    Other Topics Concern  . Not on file   Social History Narrative  . No narrative on file     BP 156/72  Ht 5\' 6"  (1.676 m)  Wt 174 lb 6.4 oz (79.107 kg)  BMI 28.16 kg/m2  Physical Exam:  Well appearing elderly man, NAD HEENT: Unremarkable Neck:  No JVD, no thyromegally Lungs:  Clear with no wheezes, rales, or rhonchi. HEART:  Regular rate rhythm, no murmurs, no rubs, no clicks Abd:  soft, positive bowel sounds, no organomegally, no rebound, no guarding Ext:  2 plus pulses, no edema, no cyanosis, no clubbing Skin:  No rashes no nodules Neuro:  CN II through XII intact, motor grossly intact  EKG - normal sinus rhythm with  AV sequential pacing  DEVICE  Normal device function.  See PaceArt for details.   Assess/Plan:

## 2014-04-22 NOTE — Assessment & Plan Note (Signed)
His blood pressure is elevated but has mostly been controlled.

## 2014-04-22 NOTE — Patient Instructions (Signed)
Your physician wants you to follow-up in: 12 months with Dr. Taylor.  You will receive a reminder letter in the mail two months in advance. If you don't receive a letter, please call our office to schedule the follow-up appointment.  Remote monitoring is used to monitor your Pacemaker or ICD from home. This monitoring reduces the number of office visits required to check your device to one time per year. It allows us to keep an eye on the functioning of your device to ensure it is working properly. You are scheduled for a device check from home on 07/26/14. You may send your transmission at any time that day. If you have a wireless device, the transmission will be sent automatically. After your physician reviews your transmission, you will receive a postcard with your next transmission date.    

## 2014-07-26 ENCOUNTER — Telehealth: Payer: Self-pay | Admitting: Cardiology

## 2014-07-26 ENCOUNTER — Encounter: Payer: Medicare Other | Admitting: *Deleted

## 2014-07-26 NOTE — Telephone Encounter (Signed)
Spoke with pt and reminded pt of remote transmission that is due today. Pt verbalized understanding.   

## 2014-07-27 ENCOUNTER — Encounter: Payer: Self-pay | Admitting: Cardiology

## 2014-08-11 ENCOUNTER — Ambulatory Visit: Payer: Self-pay | Admitting: General Practice

## 2014-08-25 ENCOUNTER — Inpatient Hospital Stay: Payer: Self-pay | Admitting: General Practice

## 2014-08-29 ENCOUNTER — Ambulatory Visit: Admit: 2014-08-29 | Disposition: A | Discharge status: Home / Self Care | Payer: Self-pay | Admitting: Neurology

## 2014-08-31 ENCOUNTER — Ambulatory Visit
Admit: 2014-08-31 | Disposition: A | Discharge status: Home / Self Care | Payer: Self-pay | Attending: Internal Medicine | Admitting: Internal Medicine

## 2014-09-01 ENCOUNTER — Encounter
Admit: 2014-09-01 | Disposition: A | Discharge status: Home / Self Care | Payer: Self-pay | Attending: Internal Medicine | Admitting: Internal Medicine

## 2014-09-08 ENCOUNTER — Encounter: Payer: Self-pay | Admitting: Cardiology

## 2014-09-14 ENCOUNTER — Telehealth: Payer: Self-pay | Admitting: *Deleted

## 2014-09-14 NOTE — Telephone Encounter (Signed)
Per family member--pt in Rehab due to having a stroke. Not able to come in for checks at this time. Family member to call back once out of rehab.

## 2014-10-01 ENCOUNTER — Encounter
Admit: 2014-10-01 | Disposition: A | Discharge status: Home / Self Care | Payer: Self-pay | Attending: Internal Medicine | Admitting: Internal Medicine

## 2014-10-01 ENCOUNTER — Ambulatory Visit
Admit: 2014-10-01 | Disposition: A | Discharge status: Home / Self Care | Payer: Self-pay | Attending: Internal Medicine | Admitting: Internal Medicine

## 2014-10-01 DEATH — deceased

## 2014-10-19 NOTE — Discharge Summary (Signed)
PATIENT NAME:  Dakota GanserJENKINS, Tatsuo E MR#:  [id number] DATE OF BIRTH:  02-26-1929  DATE OF ADMISSION:  02/25/2012 DATE OF DISCHARGE:  02/29/2012  FINAL DIAGNOSES:  1. Pneumonia, possibly secondary to Escherichia coli.  2. Acute respiratory failure secondary to #1.  3. Systemic inflammatory response syndrome secondary to #1.  4. Hypertension.  5. Diabetes.  6. Coronary artery disease.  7. Pacemaker.  8. Hyperlipidemia.  9. Inflammatory arthritis, seronegative.  10. B12 deficiency.  11. Gastroesophageal reflux disease.  12. Peripheral vascular disease.  13. Osteoarthritis.  14. Acute on chronic left-sided diastolic congestive heart failure.   HISTORY AND PHYSICAL: Please see dictated admission history and physical.   SUMMARY OF HOSPITAL COURSE: The patient was admitted with dehydration, hypotension, and systemic inflammatory response syndrome. Chest x-ray revealed evidence for right-sided pneumonia. Sputum was obtained, culture with several organisms, including Escherichia coli. He was placed on Levaquin, and his white count normalized, and heart rate improved. IV fluid was administered, and he responded well to this.   He did have some increasing hypoxia, suggestive of some pulmonary edema after he had been hydrated, and echo was performed, which revealed preserved LV function, but evidence of diastolic dysfunction and congestive heart failure. Fluids were stopped, and this improved, although he continued to have hypoxia. He underwent CT scan that showed no evidence of pulmonary embolism or mass. He has evidence of some pulmonary fibrosis, chronic obstructive pulmonary disease, as well as resolving infiltrate on the right side.   Physical therapy saw the patient and he ambulated, with improving distances, although still requiring oxygen. On the day of discharge, he ambulated with saturation falling to 85% on 3 liters, rapidly improved to 94% once he slowed down, and he was actually relatively  asymptomatic with this.   He desired to go home, and will plan anticipate having him discharged to home in stable condition with his physical activity up with a cane. Home health nursing and physical therapy will be ordered for him. He will need to check his sugars daily and record this. He will follow-up in our office in the next 1 to 2 weeks and he will follow an 1800 calorie, 2 gram sodium restricted diet. It is advised he weigh himself daily, calling for more than 2 pound gain in one day or 5 pounds in one week or increasing signs or symptoms of congestive heart failure.   DISCHARGE MEDICATIONS: 1. Plavix 75 mg p.o. daily. 2. Lisinopril 40 mg p.o. daily.  3. Glipizide extended release 5 mg p.o. twice a day. 4. Folate 1 mg p.o. daily.  5. Metformin 1000 mg p.o. in the a.m., 500 mg p.o. in the p.m.  6. Multivitamin 1 p.o. daily.  7. Calcium 500 mg p.o. daily.  8. Atenolol 50 mg p.o. daily.  9. Tramadol 50 mg p.o. every four hours p.r.n. for pain.  10. Lipitor 40 mg p.o. at bedtime.  11. Alendronate 70 mg p.o. every week.  12. Vitamin B12 500 mcg p.o. daily. 13. Pantoprazole 40 mg p.o. daily.  14. Levaquin 500 mg p.o. daily x7 days to complete the course.  15. Combivent Respimat 1 inhalation 4 times a day  ____________________________ Lynnea FerrierBert J. Klein III, MD [id number] D: 02/29/2012 13:18:00 ET     T: 02/29/2012 14:09:37 ET       JOB#: [id number] cc: Curtis SitesBert J. Klein III, MD, <Dictator> Daniel NonesBERT KLEIN MD ELECTRONICALLY SIGNED 03/05/2012 17:44

## 2014-10-19 NOTE — H&P (Signed)
PATIENT NAME:  Dakota Wood, Dakota Wood MR#:  [id number] DATE OF BIRTH:  [birth year]  DATE OF ADMISSION:  02/25/2012  CHIEF COMPLAINT: Dyspnea and lightheadedness.   HISTORY OF PRESENT ILLNESS: The patient is an 81 year old male with history of hypertension, diabetes, coronary artery disease, hyperlipidemia, with inflammatory arthritis. He states that he started to feel generally poorly yesterday, however, when he woke up this morning he felt dizzy, lightheaded, sweaty. He began coughing and was short of breath. His cough was productive of some white sputum. He had noticed some increasing edema in his feet at the end of the day but had not really noticed this worse this morning. He felt so weak that he had trouble standing up and was brought into the office by his family members. He was found to have a blood pressure of 90/40 with oxygen saturation 87% on room air and examination revealed crackles in the right base. He had one episode of nausea with vomiting after drinking some orange juice, has not had any vomiting since then but really has not eaten much. He reports his sugars have been a bit high. He has not had palpitations. He may have been slightly sweaty. He is admitted now with hypotension, dyspnea, possible early onset pneumonia, possibly early sepsis.   PAST MEDICAL HISTORY:  1. Hypertension.  2. Diabetes.  3. Coronary artery disease with prior bypass surgery.  4. Status post pacemaker placement.  5. Hyperlipidemia.  6. Inflammatory arthritis, seronegative.  7. B12 deficiency.  8. History of gastritis with prior bleeding.  9. Peripheral vascular disease with prior bilateral renal artery angioplasty.  10. Osteoarthritis.  11. Status post tonsillectomy.  12. Status post cataract surgeries.  13. Status post right carotid endarterectomy.  14. History of lumbar spine surgery.   ALLERGIES: Plaquenil, codeine, and Lipitor.   MEDICATIONS: 1. Plavix 75 mg p.o. daily.  2. Lisinopril 40 mg p.o.  daily.  3. Glipizide XL 5 mg p.o. b.i.d.  4. Pantoprazole 40 mg p.o. daily.  5. Vitamin D 1000 units p.o. daily.  6. Folate 1 mg p.o. daily.  7. Vitamin B12 1000 mcg p.o. daily.  8. Tylenol twice a day as needed for pain.  9. Metformin 1000 mg in the morning, 500 mg at night. 10. Multivitamin 1 p.o. daily.  11. Calcium 500 mg p.o. daily. 12. Simvastatin 20 mg p.o. at bedtime.  13. Amlodipine 10 mg p.o. daily.  14. Atenolol 50 mg p.o. daily.  15. Tramadol 50 mg p.o. q.6 hours p.r.n. pain or fever.   SOCIAL HISTORY: No tobacco. Rare alcohol.   FAMILY HISTORY: Diabetes, hypertension, coronary artery disease.   REVIEW OF SYSTEMS: Please see history of present illness. Remainder of review of systems is negative.   PHYSICAL EXAMINATION:   VITAL SIGNS: Blood pressure 90/40, pulse 85, respirations 14, saturation 87% on room air.   GENERAL: Elderly male, appears uncomfortable.   EYES: Pupils round and reactive to light. Postoperative changes.   EARS, NOSE, AND THROAT: External examination unremarkable. Oropharynx is moist without lesions.   NECK: Supple. Trachea in the midline. No thyromegaly.   CARDIOVASCULAR: Regular rate and rhythm without gallops or rubs. Carotid and radial pulses 2+. Distal pulses 1+.   LUNGS: Crackles are in the right base without wheeze or retractions.   ABDOMEN: Soft, nontender, nondistended. Positive bowel sounds. No guarding or rebound.   SKIN: No significant rashes or nodules.   LYMPH NODES: No cervical or supraclavicular nodes.   MUSCULOSKELETAL: No clubbing or cyanosis. Trace ankle edema  with distal hair loss.   NEUROLOGIC: Cranial nerves intact. Left leg weakness, chronic. Feels generally weak otherwise and does not feel he can stand for ambulation testing.   IMPRESSION AND PLAN:  1. Hypotension/dyspnea. Place on IV antibiotics and start IV fluid resuscitation. Will get chest x-ray today. Follow cardiac enzymes. He did have an adenosine Myoview  recently performed which showed old infarct without new findings. Given his increasing swelling, dyspnea, and hypotension, will get echocardiogram as well. Depending on results of this and cardiac enzymes, consider Cardiology consultation. Will hold all of his blood pressure medications for now. Continue Plavix for anticoagulation. Will hold on any other anticoagulation until we see what his renal function looks like and make sure there is no bleeding as well.  2. Diabetes mellitus. Cover with sliding scale insulin. Reduce his home dose of glipizide until we see how his oral intake is.  3. Dyspepsia. Continue on pantoprazole.  4. Arthritis. He has tolerated tramadol at home and will continue this for now.   ____________________________ Lynnea FerrierBert J. Klein III, MD [id number] D: 02/25/2012 12:33:43 ET T: 02/25/2012 13:04:28 ET JOB#: [id number]  cc: Lynnea FerrierBert J. Klein III, MD, <Dictator> Daniel NonesBERT KLEIN MD ELECTRONICALLY SIGNED 02/28/2012 8:05

## 2014-10-22 NOTE — Discharge Summary (Signed)
PATIENT NAME:  Dakota Wood, Dakota Wood MR#:  [id number] DATE OF BIRTH:  June 01, 1929  DATE OF ADMISSION:  04/22/2013 DATE OF DISCHARGE:  05/01/2013  FINAL DIAGNOSES: 1.  Pneumonia, likely Legionella pneumonia.  2.  Aspiration pneumonitis.  3.  Acute respiratory failure.  4.  Dehydration with hyponatremia.  5.  Hypertension.  6.  Acute renal failure.  7.  Metabolic encephalopathy.  8.  Adult onset diabetes mellitus, uncontrolled.  9.  Acute on chronic left-sided systolic congestive heart failure.  10.  Peripheral vascular disease.  11.  Hyponatremia, resolved.  12.  Pulmonary fibrosis.  13.  Acute chronic obstructive pulmonary disease exacerbation.  14.  Sepsis.   HISTORY AND PHYSICAL: Please see dictated admission history and physical.   HOSPITAL COURSE: The patient was admitted with evidence of nausea, vomiting, dehydration, with acute respiratory failure and finally right lower lobe pneumonia, which was worrisome for aspiration pneumonitis related to his vomiting. He was noted to have significant hyponatremia as well. He was initiated on antibiotic therapy, and subsequently steroids and inhaled medications were added for what appeared to be acute COPD exacerbation. Studies were sent, including a urine Legionella EIA, which was positive, suggesting Legionella pneumonia as the source of the above, which would go along with his nausea, vomiting, diarrhea, and significant hyponatremia. The case was discussed briefly with infectious disease, and he is changing over to Levaquin. He had no further GI symptoms. His hyponatremia resolved with IV fluid resuscitation. An echocardiogram was obtained to further guide this, which showed chronic left-sided systolic congestive heart failure, thought that he might have some pulmonary edema related to this, and so he was diuresed gently, however this caused worsening of his renal insufficiency failure and failure, and so it was discontinued.   His medications were  slowly reintroduced. He did require insulin therapy due to uncontrolled diabetes, mainly due to the steroids, although this began to improve as the steroids were able to be weaned.   Physical therapy saw the patient, and his ambulation was less than 100 feet, and he required significant oxygen. We recommended that the patient go to skilled nursing for further strengthening and monitoring of his oxygenation, however, he and his family members were adamant that he was not going to do this. After further discussion, they are comfortable with him going home with home health, although they are aware that this is not our overall recommendation for the patient.   We anticipated hopefully discharging him on 04/30/2013, however, he was a bit more confused with some evidence of metabolic encephalopathy. He had not slept well the night before as Lasix that was ordered for him in the morning was unfortunately given to him in the evening and he wound up having significant urination during the night. This resolved, and on the day of discharge he is awake, alert, moving much better, and was able to stand up with hand-held assistance and was able to move himself to the chair in the room. He feels ready for discharge to home and his family members feel that he is ready to return home as well. He will therefore be discharged in stable condition with his physical activity to be up with a walker as tolerated. He will be on 6 liters nasal cannula continuously. Will keep him on a low sodium diet for now, and this may need to be moved to a calorie restricted diet once he is eating a bit better. Home health physical therapy and nursing was ordered for the patient to  monitor his oxygenation and glucose and to improve his ambulation endurance. He should check his glucose and record this twice a day. Portable tank was ordered for the patient. It was recommended that he weigh himself daily, calling for more than 2 pound gain in one day or  5 pounds in one week or increasing signs or symptoms of heart failure including weight gain, fatigue, edema, and dyspnea, and he is familiar with these symptoms.   DISCHARGE MEDICATIONS: 1.  Glipizide XL 5 mg p.o. b.i.d.  2.  Folate 1 mg p.o. daily.  3.  Lipitor 40 mg p.o. at bedtime.  4.  Pantoprazole 40 mg p.o. daily.  5.  Alendronate 70 mg p.o. q. week for osteoporosis.  6.  Atenolol 25 mg p.o. daily.  7.  Multivitamin 1 p.o. daily.  8.  MiraLax 17 grams p.o. daily as needed for constipation.  9.  Plavix 75 mg p.o. daily.  10.  Prednisone starting at 20 mg daily, decreasing by 10 mg every 2 days until done.  11.  Symbicort 160/4.5 two puffs b.i.d.  12.  Singulair 10 mg p.o. daily.  13.  Levaquin 500 mg p.o. daily x 14 days to complete 21 day course.  14.  Lisinopril 40 mg p.o. daily.  15.  Combivent MDI 1 puff 4 times a day.   CODE STATUS: During this patient's hospitalization, he was FULL CODE. ____________________________ Lynnea FerrierBert J. Theo Reither III, MD bjk:sb D: 05/01/2013 07:59:07 ET T: 05/01/2013 08:27:00 ET JOB#: [id number]  cc: Lynnea FerrierBert J. Emmalia Heyboer III, MD, <Dictator> Daniel NonesBERT Bernadette Armijo MD ELECTRONICALLY SIGNED 05/12/2013 20:18

## 2014-10-22 NOTE — H&P (Signed)
PATIENT NAME:  Dakota Wood, Dakota Wood MR#:  [id number] DATE OF BIRTH:  10-Feb-1929  ADMITTING PHYSICIAN: Gladstone Lighter, M.D.   PRIMARY CARE PHYSICIAN: Ramonita Lab, M.D.  CHIEF COMPLAINT: Extreme weakness from nausea, vomiting and worsening cough.   HISTORY OF PRESENT ILLNESS: Mr. Kunath is an elderly pleasant 81 year old Caucasian male with past medical history significant for coronary artery disease status post bypass graft surgery, history of pacemaker, diabetes, hypertension, severe degenerative arthritis, who comes to the hospital secondary to the above-mentioned symptoms. The patient is extremely weak and in a lot of pain from his hips, so most of the history is obtained from his daughter at bedside.   According to the daughter, the patient lives at home with his wife, usually functional at baseline, using a cane. Most of his mobility is limited secondary to bilateral hip pain at baseline. He was fine 5 days ago when he went to church last Sunday, comes back, and since then he has been having nausea, vomiting and diarrhea. Denies any sick contacts. No fevers or chills but symptoms got worse over the last 3 days, and his cough was worse with productive phlegm now and also having low-grade fevers. His breathing was getting rough so presented to the ER.   He had a temp of 100.9 degrees Fahrenheit with elevated white count and lactic acid, and CT chest revealing a right lower lobe pneumonia, so he is being admitted for sepsis secondary to pneumonia likely.   PAST MEDICAL HISTORY:  1.  Hypertension.  2.  Diabetes.  3.  Coronary artery disease status post bypass graft surgery.  4.  Status post pacemaker placement, likely for incomplete heart block.  5.  Hyperlipidemia.  6.  Degenerative arthritis.  7.  B12 deficiency.  8.  History of gastritis.  9.  Peripheral vascular disease.   PAST SURGICAL HISTORY:  1.  Coronary artery bypass graft surgery.  2.  Pacemaker placement.  3.  Lumbar spine  surgery.  4.  Right carotid endarterectomy.  5.  Cataract surgery.  6.  Tonsillectomy.  7.  Bilateral renal artery angioplasty.   ALLERGIES TO MEDICATIONS: TRAMADOL, TAPE, PLAQUENIL, CODEINE AND LIPITOR.   CURRENT HOME MEDICATIONS:  Alendronate 1 tablet p.o. once a week.  Atenolol 25 mg p.o. daily.  Combivent Respimat 2 puffs four times a day.  Folic acid 1 mg p.o. daily.  Glipizide 5 mg p.o. b.i.d.  Lipitor 40 mg p.o. daily.  Lisinopril 40 mg p.o. daily.  Metformin 1000 mg p.o. in the morning.  Metformin 500 mg p.o. at bedtime.  MiraLax powder daily p.r.n. for constipation.  Multivitamin daily.  Os-Cal vitamin D 1 tablet p.o. daily.  Protonix 40 mg daily.  Plavix 75 mg p.o. daily.  Tylenol 500 mg one to 2 tablets q.4-6 hours p.r.n.   SOCIAL HISTORY: Lives at home with his wife. No history of any smoking or alcohol use.   FAMILY HISTORY: Hypertension and diabetes runs in the family.   REVIEW OF SYSTEMS: CONSTITUTIONAL: Positive for fever, fatigue and weakness.  EYES: No blurred vision, double vision, glaucoma, or inflammation. Has history of cataract surgery.  ENT: Positive for hearing loss. No tinnitus, ear pain, epistaxis or discharge.  RESPIRATORY: Positive for cough, wheezing and COPD and dyspnea on exertion.  CARDIOVASCULAR: No chest pain, orthopnea, edema, palpitations or syncope. No arrhythmias. Positive for dyspnea on exertion.  GASTROINTESTINAL: Positive for nausea, vomiting, diarrhea. No abdominal pain, hematemesis or melena.  GENITOURINARY: No dysuria, hematuria, urinary calculus, frequency or incontinence.  ENDOCRINE: No polyuria, nocturia, thyroid problems, heat or cold intolerance.  HEMATOLOGY: No anemia, easy bruising or bleeding.  SKIN: No acne, rash or lesions.  MUSCULOSKELETAL: Positive for low back pain and severe bilateral leg pain.  NEUROLOGIC: No numbness, weakness, CVA, TIA or seizures.  PSYCHOLOGICAL: No anxiety, insomnia, or depression.   PHYSICAL  EXAMINATION:  VITAL SIGNS: Temperature 98.6 degrees Fahrenheit, pulse 98, respirations 20, blood pressure 117/54, pulse oximetry 99% on room air.  GENERAL: Elderly male, well built, well nourished, lying in bed, appears to be in moderate distress secondary to hip pain.  HEENT: Normocephalic, atraumatic. Pupils equal, round, reacting to light. Postsurgical pupils, anicteric sclerae. Extraocular movements intact.  OROPHARYNX: Dry but no erythema, mass or exudates.  NECK: Supple. No thyromegaly, JVD, or carotid bruits. No lymphadenopathy.  LUNGS: Scattered expiratory wheeze throughout the lungs and rhonchi noted in the bilateral lower lobes, right greater than left. Minimal use of accessory muscles on exertion.  CARDIOVASCULAR: S1, S2. Regular rate and rhythm, 3/6 systolic murmur present.  ABDOMEN: Soft, nontender, nondistended. No hepatosplenomegaly. Normal bowel sounds. EXTREMITIES: No pedal edema, no clubbing or cyanosis, and 2+ dorsalis pedis pulses palpable bilaterally.  SKIN: No acne, rash or lesions.  LYMPHATICS: No cervical lymphadenopathy.  NEUROLOGIC: Cranial nerves intact. Motor strength is 5/5 in all 4 extremities with generalized weakness. Sensation is intact.  PSYCHOLOGIC: Awake, alert, oriented x3.   LABORATORY DATA: WBC 17.3, hemoglobin 10.4, hematocrit 31.2, platelet count 241.  Sodium 130, potassium 3.8, chloride 100, bicarb 21, BUN 25, creatinine 1.6, glucose 198 and calcium of 8.0.   ALT 22, AST 39, alk phos 79, total bilirubin 0.6, albumin of 3.6, magnesium 1.1. Phosphorus is 3.7. Lipase is 125. CK 291, CK-MB 1.2.   Hip x-ray on the right side showing severe degenerative changes of the right hip.   Cervical spine AP x-ray showing C7 is not evaluated. Limited images and degenerative changes of the mid cervical spine with grade 1 anterolisthesis of C5 with respect to C6.   Chest x-ray showing findings suggestive of CHF, either chronic or acute-on-chronic, underlying COPD  suspected as well. No definite acute bony abnormality noted.   CT of the chest, abdomen and pelvis showing a right lower lobe pneumonia with air bronchograms, bilateral nephrolithiasis without hydronephrosis, colonic diverticulosis with no  diverticulitis and pacemaker present and CABG changes are present as well.   EKG showing sinus rhythm with some PVCs. Heart rate of 95.   ASSESSMENT AND PLAN: An 81 year old male with history of hypertension, diabetes, coronary artery disease and osteoarthritis who presents with sepsis and acute hypoxic respiratory failure.  1.  Sepsis, likely from pneumonia. Blood cultures are drawn. Urinalysis has also been sent to rule out urinary tract infection. He is started on Rocephin and azithromycin at this time and monitored on off unit unit telemetry.  2.  Acute hypoxic respiratory failure, likely from underlying pneumonia with chronic obstructive pulmonary disease and pulmonary fibrosis. The patient also has a history of diastolic congestive heart failure. His last ejection fraction is 55% to 60%. For now, continue antibiotics and IV fluids, oxygen support, and wean as tolerated. For his wheezing, will give a dose of Solu-Medrol, continue nebulizers and also place him on Symbicort.  3.  Acute renal failure. Hold lisinopril and metformin and continue IV fluids and follow up.  4.  Hypertension. Continue atenolol and hold his lisinopril at this time.  5.  Diabetes mellitus. Hold metformin and continue sliding-scale insulin. If appetite is better, then will  add glipizide.  6.  Coronary artery disease status post bypass graft surgery, stable. Continue his Plavix and other cardiac medications.  7.  Severe bilateral hip pain, right greater than left, pain medications p.r.n. secondary to severe arthritis. If symptoms get worse, likely hip injection might help.  8.  Gastrointestinal and deep vein thrombosis prophylaxis.   CODE STATUS: FULL CODE.   TIME SPENT ON ADMISSION: 50  minutes.    ____________________________ Gladstone Lighter, MD rk:np D: 04/22/2013 16:19:08 ET T: 04/22/2013 18:19:27 ET JOB#: [id number]  cc: Gladstone Lighter, MD, <Dictator> Adin Hector, MD Gladstone Lighter MD ELECTRONICALLY SIGNED 05/15/2013 17:09

## 2014-10-24 NOTE — Discharge Summary (Signed)
PATIENT NAME:  Dakota Wood, Dakota Wood MR#:  [id number] DATE OF BIRTH:  04/02/1929  DATE OF ADMISSION:  06/24/2011 DATE OF DISCHARGE:  06/25/2011  FINAL DIAGNOSES:  1. Syncope, possibly secondary to rhythm disturbance or peripheral vascular disease.  2. Coronary artery disease with prior coronary artery bypass surgery.  3. Hypertension.  4. History of peripheral vascular disease and prior carotid endarterectomy.  5. Adult onset diabetes mellitus.  6. Degenerative disk disease with prior back surgery.  7. Hypertension.   HISTORY AND PHYSICAL: Please see dictated admission history and physical.    HOSPITAL COURSE: Patient was admitted after an episode of syncope. The actual syncopal episode was unwitnessed, it is unclear other than initially having some dizziness exactly what caused this. Cardiac enzymes were sent, which were negative. There was no evidence of bleeding. He has a pacemaker in place, and could not have MRI. Carotid Doppler's revealed left vertebral occlusion, which is no different from 2009. He has had carotid artery endarterectomy in the past, and his carotid arteries appeared patent. Cardiology saw the patient and his pacemaker was interrogated, without ventricular tachycardia or significant dysrhythmias evident. Telemetry was followed, which showed paced and normal sinus rhythms. He ambulated in the room and felt back to baseline without further syncope or significant dizziness. He strongly desired to go home as today is his birthday, as well as the holidays, and he is aware that we are not able to tell him what event led to his hospitalization. He had apparently had some clonic brief episodes, and EEG was performed with the over read pending at this time.   I discussed with the patient and family members, we will allow him to go home at this time, with the understanding that if he has any further symptoms he needs to return immediately and he is understanding of the risk of going home  without diagnosis. Because this is very similar to an episode he had in 2009, which led to his carotid endarterectomy, will ask him to follow up with vascular surgery for their opinions. Cardiology as arranged Holter monitoring to evaluate for any other dysrhythmias which may have contributed to the above. His physical activity should be up as tolerated. He will follow a 2 gram sodium, 1800 calorie ADA diet. He will check sugars and record this daily. It is recommended that he check weight daily, calling for more than 2 pounds gain in one day or 5 pounds in one week or increasing signs or symptoms of heart failure. He does have some mild chronic left-sided systolic congestive heart failure with his ejection fraction 45% by echo at this time, unchanged.   FOLLOW UP: We will anticipate him following up with us in the next two weeks, but again will return sooner for new or worsening symptoms.   DISCHARGE MEDICATIONS:  1. Atenolol 50 mg p.o. daily.  2. Lipitor 40 mg p.o. at bedtime.  3. Glipizide XL 5 mg p.o. every a.m.  4. Lisinopril 40 mg p.o. daily.  5. Plavix 75 mg p.o. daily.  6. Metformin 500 mg p.o. b.i.d.   ____________________________ Lynnea FerrierBert J. Klein III, MD bjk:cms D: 06/25/2011 12:36:06 ET T: 06/27/2011 11:01:23 ET JOB#: [id number]  cc: Lynnea FerrierBert J. Klein III, MD, <Dictator> Daniel NonesBERT KLEIN MD ELECTRONICALLY SIGNED 07/04/2011 7:50

## 2014-10-25 LAB — SURGICAL PATHOLOGY

## 2014-10-31 NOTE — Consult Note (Signed)
PATIENT NAME:  Dakota Wood, Dakota Wood DATE OF BIRTH:  [birth year]  DATE OF CONSULTATION:  09/06/2014  REQUESTING PHYSICIAN:  Dr. (Dictation Anomaly)Klein CONSULTING GASTROENTEROLOGIST:   Midge Miniumarren Wohl, MD   REASON FOR CONSULTATION:  PEG consideration.   PRIMARY CARE PROVIDER: Dr. (Dictation Anomaly)Klein.   HISTORY OF PRESENT ILLNESS: [name] is an unfortunate 81 year old male who was admitted after left hip arthroplasty, 08/25/2014, with hypertension and acute cerebrovascular accident. He has been followed by neurology as well as orthopedics this admission. He has residual left-sided deficits and is having difficulty eating and swallowing. He has poor p.o. intake.  Attending is requesting PEG placement for nutrition at this time. Both he and his wife agree for PEG placement.   PAST MEDICAL AND SURGICAL HISTORY: CVA, atrial fibrillation, left upper extremity DVT, CABG, pacemaker, left hip arthroplasty, hypertension, diabetes mellitus, COPD, congestive heart failure, degenerative arthritis, hyperlipidemia, B12 deficiency, pneumonia, gastritis, peripheral vascular disease, lumbar spine surgery, right carotid endarterectomy, cataract surgery, tonsillectomy, bilateral renal artery angioplasty.  EGD with Dr. Marva PandaSkulskie, 06/06/2012, he had Barrett esophagus and history of hiatal hernia.  MEDICATIONS PRIOR TO ADMISSION: Extended release acetaminophen 65 mg b.i.d., aluminum hydroxide/magnesium hydroxide/simethicone 400 mg/400 mg/40 mg per 5 mL, 30 mL q. 6 hours p.r.n., atenolol 50 mg daily, bisacodyl 10 mg rectal suppositories daily, Cipro 250 mg q. 12 hours, Lovenox injections, fluticasone 250 mcg 1 puff b.i.d., folic acid 1 mg daily, gabapentin 100 mg 2 capsules at bedtime, glipizide XL 5 mg b.i.d., insulin, unknown dose, lisinopril 40 mg in the morning, magnesium hydroxide 8% oral solution 30 mL b.i.d., metformin 500 mg b.i.d., and multivitamin daily, and oxycodone 5 mg 1-2 tablets q. 4 hours  p.r.n. pain, pantoprazole 40 mg q.a.m., Plavix 75 mg daily, ProAir HFA 2 puffs q.i.d., simvastatin 20 mg daily, Systane 2 drops b.i.d., (Dictation Anomaly)<<MISSING TEXT>> 50 mg 1-2 tablets every 4 hours p.r.n., trazodone 50 mg 1 tablet at bedtime p.r.n.,  B12, 500 mcg daily, vitamin D3, 2000 International Units daily, and vitamin E 400 International Units daily.   ALLERGIES: CODEINE, LIPITOR, PLAQUENIL, TRAMADOL, AND TAPE CAUSES BLISTERS.   FAMILY HISTORY: Noncontributory.   SOCIAL HISTORY: There is no tobacco, alcohol, or illicit drug use. He is retired. He lives with his wife. 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  REVIEW OF SYSTEMS:  MUSCULOSKELETAL: He has left-sided weakness.  HEENT: He has slurred speech.  See HPI, otherwise negative complete review of systems.   PHYSICAL EXAMINATION:  VITAL SIGNS:  BMI of 28.4. Weight 175.6 pounds, height 65.9 inches, temperature 98.3, pulse 94, respirations 18, blood pressure 139/78, O2 saturation 92% on 2 liters via nasal cannula.  GENERAL: He is a thin Caucasian male who is alert, oriented, pleasant, and cooperative. He is accompanied by his wife and family at the bedside.  HEENT: Sclerae clear, nonicteric. Conjunctivae pink. Oropharynx pink and moist without any lesions.  NECK: Supple without any mass or thyromegaly.  CHEST: Heart rate is irregularly regular.  LUNGS: Clear to auscultation bilaterally.  ABDOMEN: Positive bowel sounds x 4. No bruits auscultated. Abdomen is soft, nontender, nondistended, without hepatosplenomegaly or mass.  EXTREMITIES: He has postoperative changes in the left leg. He has left-sided weakness, decreased strength.  SKIN: Pink, warm and dry without any rash or jaundice.  PSYCHIATRIC:  He is alert, cooperative, normal mood and affect.   LABORATORY STUDIES: Glucose was 145, BUN 20, sodium 147, chloride 111, calcium 7.9. LFTs were normal except for ALT of 64, total  protein 4.9, albumin 2.5. White blood  cell count 11.6, hemoglobin 8.8, which has been stable, hematocrit 28.5, platelets 167,000.   IMPRESSION: Dakota Wood will have percutaneous endoscopic gastrostomy tube placement tomorrow by Dr. Servando Snare due to decreased p.o. intake and dysphagia, status post cerebrovascular accident. I have discussed risks and benefits of the procedure, which include, but are not limited to, bleeding, infection, perforation, and drug reaction. I have discussed percutaneous endoscopic gastrostomy tube and the fact that it will not decrease the risk of aspiration.  I also discussed risks of infection with the percutaneous endoscopic gastrostomy tube as well as to the skin, complications with the percutaneous endoscopic gastrostomy tube including the fact that it may become dislodged. They are concerned that he may pull at the percutaneous endoscopic gastrostomy tube and we discussed use of abdominal binder for this.   PLAN: 1.  Ancef on call to the OR. 2.  Heparin will be on hold after midnight for the procedure.  3.  NPO after midnight.   These services provided by Joselyn Arrow, NP, under collaborative agreement with  Midge Minium, MD.   Thank you for allowing [place] to participate in his care.    ____________________________ Joselyn Arrow, NP klj:LT D: 09/06/2014 19:58:32 ET T: 09/06/2014 20:27:17 ET JOB#: [id number]  cc: Joselyn Arrow, NP, <Dictator> Dr. Graciela Husbands

## 2014-10-31 NOTE — Consult Note (Signed)
PATIENT NAME:  Dakota Wood, Dakota Wood MR#:  [id number] DATE OF BIRTH:  [birth year]  DATE OF CONSULTATION:  08/29/2014  CONSULTING PHYSICIAN:  Pauletta BrownsYuriy Jlyn Cerros, MD  REASON FOR CONSULTATION: Left-sided weakness. Information obtained from the chart and the patient's family.   HISTORY OF PRESENT ILLNESS:  This is an 81 year old male admitted for left hip replacement. Postop, the patient had periods of hypotension, bradycardia, and decreased urine output on and off started on pressor medications. He was moved to critical care unit for pressors. While in the critical care unit yesterday was found to have left-sided weakness that appears to be acute. CAT scan of the head did not show any acute intracranial abnormalities. That was done yesterday.   HOME MEDICATIONS: Have been reviewed.   REVIEW OF SYSTEMS: Unable to obtain his review of systems due to current condition.   NEUROLOGIC: The patient does not follow commands. He moves the right upper and right lower extremity spontaneously. He appears to have left-sided neglect. He responds to visual threats on the right side, but not on the left. Pralysis left upper and left lower extremity.   IMPRESSION: An 81 year old male admitted for left hip replacement, periods of hypertension, transferred to critical care unit for pressor medications, found to have left-sided hemidysphagia yesterday status post CT head, no acute intracranial abnormality.   PLAN: Will obtain another CAT scan today to see evolution of the stroke. We are unable to obtain MRI as the patient has a pacemaker. The patient should be on antiplatelet therapy, physical therapy and occupational therapy status post discussion with the family. I told the family that the patient will likely improve but is going to have significant deficits on the left upper and left lower extremity, likely dysarthric speech. Patient is over 3365 as stroke is likely related to hypoperfusion. Suspect left middle cerebral artery  stroke. Thank you. It was a pleasure seeing this patient. Please call with any questions.    ___________________________ Pauletta BrownsYuriy Shivan Hodes, MD yz:mc D: 08/29/2014 11:54:00 ET T: 08/29/2014 12:11:03 ET JOB#: [id number]  cc: Pauletta BrownsYuriy Jasreet Dickie, MD, <Dictator> Pauletta BrownsYURIY Bernece Gall MD ELECTRONICALLY SIGNED 09/16/2014 12:15

## 2014-10-31 NOTE — Consult Note (Signed)
Chief Complaint:  Subjective/Chief Complaint RN states PEG working well.  Tolerated 1st feed.  Site has been clear.   VITAL SIGNS/ANCILLARY NOTES: **Vital Signs.:   09-Mar-16 12:29  Vital Signs Type Q 4hr  Temperature Temperature (F) 98.4  Celsius 36.8  Temperature Source oral  Pulse Pulse 102  Respirations Respirations 18  Systolic BP Systolic BP 045  Diastolic BP (mmHg) Diastolic BP (mmHg) 64  Mean BP 79  Pulse Ox % Pulse Ox % 95  Pulse Ox Activity Level  At rest  Oxygen Delivery 2L   Brief Assessment:  GEN no acute distress, thin, Awake, nonverbal   Cardiac Regular   Gastrointestinal Normal   Gastrointestinal details normal Soft  Nontender  Nondistended  Bowel sounds normal  No rebound tenderness  No gaurding  +PEG site clear, bumper @3 .5   EXTR negative cyanosis/clubbing, negative edema   Additional Physical Exam Skin: pale, warm, dry   Lab Results: Routine Chem:  09-Mar-16 05:18   Magnesium, Serum 1.7 (1.7-2.4 THERAPEUTIC RANGE: 4-7 mg/dL TOXIC: > 10 mg/dL  ----------------------- NOTE: New Reference Range  08/24/14)  Phosphorus, Serum 2.8 (2.5-4.6 NOTE: New Reference Range  08/24/14)  Glucose, Serum  162 (65-99 NOTE: New Reference Range  08/24/14)  BUN  21 (6-20 NOTE: New Reference Range  08/24/14)  Creatinine (comp) 0.86 (0.61-1.24 NOTE: New Reference Range  08/24/14)  Sodium, Serum 144 (135-145 NOTE: New Reference Range  08/24/14)  Potassium, Serum 3.5 (3.5-5.1 NOTE: New Reference Range  08/24/14)  Chloride, Serum 111 (101-111 NOTE: New Reference Range  08/24/14)  CO2, Serum 25 (22-32 NOTE: New Reference Range  08/24/14)  Calcium (Total), Serum  7.8 (8.9-10.3 NOTE: New Reference Range  08/24/14)  Anion Gap 8  eGFR (African American) >60  eGFR (Non-African American) >60 (eGFR values <79m/min/1.73 m2 may be an indication of chronic kidney disease (CKD). Calculated eGFR is useful in patients with stable renal function. The eGFR  calculation will not be reliable in acutely ill patients when serum creatinine is changing rapidly. It is not useful in patients on dialysis. The eGFR calculation may not be applicable to patients at the low and high extremes of body sizes, pregnant women, and vegetarians.)  Routine Coag:  09-Mar-16 08:59   Prothrombin  15.1 (11.4-15.0 NOTE: New Reference Range  07/30/14)  INR 1.2 (INR reference interval applies to patients on anticoagulant therapy. A single INR therapeutic range for coumarins is not optimal for all indications; however, the suggested range for most indications is 2.0 - 3.0. Exceptions to the INR Reference Range may include: Prosthetic heart valves, acute myocardial infarction, prevention of myocardial infarction, and combinations of aspirin and anticoagulant. The need for a higher or lower target INR must be assessed individually. Reference: The Pharmacology and Management of the Vitamin K  antagonists: the seventh ACCP Conference on Antithrombotic and Thrombolytic Therapy. CWUJWJ.1914Sept:126 (3suppl): [id number] A HCT value >55% may artifactually increase the PT.  In one study,  the increase was an average of 25%. Reference:  "Effect on Routine and Special Coagulation Testing Values of Citrate Anticoagulant Adjustment in Patients with High HCT Values." American Journal of Clinical Pathology 2006;126:400-405.)  Routine Hem:  09-Mar-16 05:18   WBC (CBC) 9.4  RBC (CBC)  2.98  Hemoglobin (CBC)  8.9  Hematocrit (CBC)  28.3  Platelet Count (CBC) 172  MCV 95  MCH 29.8  MCHC  31.4  RDW  19.0  Neutrophil % 63.9  Lymphocyte % 29.8  Monocyte % 4.5  Eosinophil % 1.4  Basophil %  0.4  Neutrophil # 6.0  Lymphocyte # 2.8  Monocyte # 0.4  Eosinophil # 0.1  Basophil # 0.0 (Result(s) reported on 08 Sep 2014 at 06:23AM.)   Assessment/Plan:  Assessment/Plan:  Assessment s/p PEG: Tolerating feeding well.  Site clear.   Plan Will sign off, Please call if you have any  questions or concerns about PEG   Electronic Signatures: Andria Meuse (NP)  (Signed 09-Mar-16 12:48)  Authored: Chief Complaint, VITAL SIGNS/ANCILLARY NOTES, Brief Assessment, Lab Results, Assessment/Plan   Last Updated: 09-Mar-16 12:48 by Andria Meuse (NP)

## 2014-10-31 NOTE — Consult Note (Signed)
Brief Consult Note: Diagnosis: PEG consult.   Patient was seen by consultant.   Comments: [name] will ahve PEG placement tomorrow by Dr ServaLovell Sheehanndo SnareWohl.  Discussed risks/benefits of procedure which include but are not limited to bleeding, infection, perforation & drug reaction.  Patient & wife agree with this plan & consent will be obtained.  Ancef on call to OR, Heparin on hold after MN, pt NPO after MN  Thanks for allowing us to participate in his care.  Please see full dictated note. #[id number].  Electronic Signatures: Joselyn ArrowJones, Zeus Marquis L (NP)  (Signed 07-Mar-16 19:58)  Authored: Brief Consult Note   Last Updated: 07-Mar-16 19:58 by Joselyn ArrowJones, Eames Dibiasio L (NP)

## 2014-10-31 NOTE — Consult Note (Signed)
PATIENT NAME:  Dakota Wood, Dakota Wood MR#:  [id number] DATE OF BIRTH:  02-21-1929  DATE OF CONSULTATION:  08/25/2014  PRIMARY CARE PROVIDER: Dr. Graciela Husbands     CONSULTING PHYSICIAN:   1.  Shaune Pollack, MD 2.  Francesco Sor, MD  REASON FOR ADMISSION: Hypotension.  HISTORY:  The patient is an 81 year old Caucasian male with a history of hypertension, diabetes, chronic obstructive pulmonary disease, congestive heart failure, who came to the hospital today for elective hip surgery for degenerative arthritis. The patient just got left hip total hip arthroplasty this morning. The patient was noticed to have a low blood pressure in the 80s. He was treated with normal saline 500 mL bolus. Blood pressure is still low at 80s so Dr. Ernest Pine requested a medical consult for hypotension. The patient is alert, awake, oriented and in no acute distress. According to the patient and the patient's wife, the patient has not eaten well for the past few days before this surgery. The patient feels weak but denies any fever or chills. No headache or dizziness. Denies any nausea, vomiting, or diarrhea. No dysuria, hematuria. No chest pain or palpitation. The patient had comprehensive left hip pain in the surgical area and the back pain. According to the patient's wife, the patient got regular medication at home this morning possibly including hypertension medication. According to nurse the patient loss 250 mL blood during the surgery.   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   PAST SURGICAL HISTORY: He had a CABG, pacemaker placement, lumbar spine surgery, right carotid endarterectomy, cataract surgery, tonsillectomy, bilateral renal artery angioplasty.   SOCIAL  HISTORY: No smoking, alcohol drinking, or illicit drugs. He is living at home with his wife.   FAMILY HISTORY: Hypertension, diabetes.   ALLERGIES: CODEINE, LIPITOR, PLAQUENIL, TRAMADOL, TAPE.  CURRENT MEDICATIONS: 1.  Ancet 2 gram IV 1 dose.  2.  Acetaminophen 10 mg per mL injection, 1000 mg IV q. 6 hours. 3.  Acetaminophen ES tablet [phone] mg oral q. 4 hours p.r.n.  4.  Mag-al+XS 30 mL q. 6 hours p.r.n. for indigestion and heartburn. 5.  Norvasc  10 p.o. b.i.d.  6.  Humeral 50 mg p.o. daily. 7.  Dulcolax suppository 10 mg rectal daily p.r.n. for constipation.  8.  Refresh Celluvisc ophthalmic drops 2 drops b.i.d. for both eyes.  9.  Vitamin D3, 2000 units daily. 10.  Cyanocobalamin 500 mcg p.o. daily. 11.  Synacol S 1 tab b.i.d. 12.  Folic acid 1 mg p.o. daily, continue 200 mg p.o. at bedtime. 13.  Glipizide 5 mg p.o. b.i.d. a.c. sliding scale. 14.  Lisinopril 40 mg p.o. daily. 15.  MOM 30 mL p.r.n. p.o. for constipation. 16.  Metformin 500 mg p.o. b.i.d. 17.  Reglan 10 mg oral q.i.d.  18.  Morphine 1-4 mg IV push q. 4 hours p.r.n.  19.  Multivitamin 1 tablet p.o. daily. 20.  Pantoprazole 40 mg p.o. b.i.d.  21.  Roxicodone 5-10 mg p.o. q. 4 hours p.r.n. 22.  Zocor 20 mg p.o. at bedtime. 23.  Trazodone 50 mg p.o. at bedtime p.r.n. for insomnia. 24.  Vitamin E 400 units p.o. daily. 25.  Lovenox 30 mg subcutaneous q. 12 hours.  REVIEW OF SYSTEMS: CONSTITUTIONAL: The patient denies any fever or chills. No headache or dizziness but has generalized weakness and  poor oral intake.  EYES: No double vision or blurry vision.  ENT: No postnasal drip, slurred speech, or dysphagia.  CARDIOVASCULAR: No chest pain, palpitation, orthopnea, or nocturnal dyspnea. No leg edema.  PULMONARY: No cough, sputum, shortness of breath, or hematemesis.  GASTROINTESTINAL: No abdominal pain, nausea, vomiting, or diarrhea. No melena or bloody stool.  GENITOURINARY: No dysuria, hematuria, or incontinence.   SKIN: No rash or jaundice.  NEUROLOGIC: No syncope, loss of consciousness, or seizure.  ENDOCRINOLOGY: No polyuria, polydipsia, heat or cold intolerance.  HEMATOLOGY: No easy bleeding or bleeding.   PHYSICAL EXAMINATION:  VITAL SIGNS: Temperature 95.9, blood pressure 81/37 and later it was 89/38, pulse 59, oxygen saturation 98% on room air. Oxygen 4 liters.  GENERAL: The patient is alert, awake, looks weak, in no acute distress.  HEENT: Pupils round, equal and reactive to light and accommodation. Moist oral mucosa. Clear oropharynx.  NECK: Supple. No JVD or carotid bruit. No lymphadenopathy. No thyromegaly.  CARDIOVASCULAR: S1 and S2. Regular rate and rhythm. No murmurs or gallops.  PULMONARY: Bilateral air entry. No wheezing or rales. No use of accessory muscles.  ABDOMEN: Soft. No distention or tenderness. No organomegaly. Bowel sounds present.  EXTREMITIES: No edema, clubbing, or cyanosis. No calf tenderness. Pedal pulse is present.  SKIN: No rash or jaundice.  NEUROLOGY: Alert and oriented x 3. No intracranial deficit. The patient cannot move his lower extremities due to surgery. Foley catheter in situ.   DIAGNOSTIC DATA: No laboratory data today. I just ordered CBC and a BMP stat.   IMPRESSION: 1.  Hypotension possibly due to a combination of poor oral intake, surgery, and hypertension medication.  2.  History of chronic obstructive, chronic heart disease and known ejection fraction, hypertension, and diabetes.   RECOMMENDATIONS: 1.  For hypotension the patient was treated with normal saline 500 mL bolus. I will start another 500 mL bolus now and hold HTN medications.  2.  For chronic obstructive pulmonary disease I will do a nebulizer treatment p.r.n. with spirometry.  3.  Deep venous thrombosis prophylaxis with Lovenox.   I discussed the patient's condition and the recommendations with the patient and the patient's wife and daughter discussed with nurse.    TIME SPENT: About  46 minutes    ____________________________ Shaune PollackQing Tannie Koskela, MD qc:mc D: 08/25/2014 15:58:21 ET T: 08/25/2014 16:28:02 ET JOB#: [id number]  cc: Shaune PollackQing Shyvonne Chastang, MD, <Dictator> Shaune PollackQING Hazely Sealey MD ELECTRONICALLY SIGNED 08/28/2014 17:57

## 2014-10-31 NOTE — Consult Note (Signed)
PATIENT NAME:  Dakota Wood, Dakota Wood MR#:  [id number] DATE OF BIRTH:  04-21-1929  DATE OF CONSULTATION:  09/06/2014  REQUESTING PHYSICIAN:  Daniel NonesBert Klein, MD CONSULTING GASTROENTEROLOGIST:   Midge Miniumarren Wohl, MD   REASON FOR CONSULTATION:  PEG consideration.   PRIMARY CARE PROVIDER: Dr. Daniel NonesBert Klein.   HISTORY OF PRESENT ILLNESS: Dakota Wood is an unfortunate 81 year old male who was admitted after left hip arthroplasty, 08/25/2014, with hypertension and acute cerebrovascular accident. He has been followed by neurology as well as orthopedics this admission. He has residual left-sided deficits and is having difficulty eating and swallowing. He has poor p.o. intake.  Attending is requesting PEG placement for nutrition at this time. Both he and his wife agree for PEG placement.   PAST MEDICAL AND SURGICAL HISTORY: CVA, atrial fibrillation, left upper extremity DVT, CABG, pacemaker, left hip arthroplasty, hypertension, diabetes mellitus, COPD, congestive heart failure, degenerative arthritis, hyperlipidemia, B12 deficiency, pneumonia, gastritis, peripheral vascular disease, lumbar spine surgery, right carotid endarterectomy, cataract surgery, tonsillectomy, bilateral renal artery angioplasty.  EGD with Dr. Marva PandaSkulskie, 06/06/2012, he had Barrett esophagus and history of hiatal hernia.  MEDICATIONS PRIOR TO ADMISSION: Extended release acetaminophen 65 mg b.i.d., aluminum hydroxide/magnesium hydroxide/simethicone 400 mg/400 mg/40 mg per 5 mL, 30 mL q. 6 hours p.r.n., atenolol 50 mg daily, bisacodyl 10 mg rectal suppositories daily, Cipro 250 mg q. 12 hours, Lovenox injections, fluticasone 250 mcg 1 puff b.i.d., folic acid 1 mg daily, gabapentin 100 mg 2 capsules at bedtime, glipizide XL 5 mg b.i.d., insulin, unknown dose, lisinopril 40 mg in the morning, magnesium hydroxide 8% oral solution 30 mL b.i.d., metformin 500 mg b.i.d., and multivitamin daily, and oxycodone 5 mg 1-2 tablets q. 4 hours p.r.n. pain, pantoprazole 40 mg  q.a.m., Plavix 75 mg daily, ProAir HFA 2 puffs q.i.d., simvastatin 20 mg daily, Systane 2 drops b.i.d., tramadol 50 mg 1-2 tablets every 4 hours p.r.n., trazodone 50 mg 1 tablet at bedtime p.r.n.,  B12, 500 mcg daily, vitamin D3, 2000 International Units daily, and vitamin Wood 400 International Units daily.   ALLERGIES: CODEINE, LIPITOR, PLAQUENIL, TRAMADOL, and TAPE CAUSES BLISTERS.   FAMILY HISTORY: Noncontributory.   SOCIAL HISTORY: There is no tobacco, alcohol, or illicit drug use. He is retired. He lives with his wife. 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  REVIEW OF SYSTEMS:  MUSCULOSKELETAL: He has left-sided weakness.  HEENT: He has slurred speech.  See HPI, otherwise negative complete review of systems.   PHYSICAL EXAMINATION:  VITAL SIGNS:  BMI of 28.4. Weight 175.6 pounds, height 65.9 inches, temperature 98.3, pulse 94, respirations 18, blood pressure 139/78, O2 saturation 92% on 2 liters via nasal cannula.  GENERAL: He is a thin Caucasian male who is alert, oriented, pleasant, and cooperative. He is accompanied by his wife and family at the bedside.  HEENT: Sclerae clear, nonicteric. Conjunctivae pink. Oropharynx pink and moist without any lesions.  NECK: Supple without any mass or thyromegaly.  CHEST: Heart rate is irregularly regular.  LUNGS: Clear to auscultation bilaterally.  ABDOMEN: Positive bowel sounds x 4. No bruits auscultated. Abdomen is soft, nontender, nondistended, without hepatosplenomegaly or mass.  EXTREMITIES: He has postoperative changes in the left leg. He has left-sided weakness, decreased strength.  SKIN: Pink, warm and dry without any rash or jaundice.  PSYCHIATRIC:  He is alert, cooperative, normal mood and affect.   LABORATORY STUDIES: Glucose was 145, BUN 20, sodium 147, chloride 111, calcium 7.9. LFTs were normal except for ALT of 64, total protein 4.9,  albumin 2.5. White blood cell count 11.6, hemoglobin 8.8, which has been stable,  hematocrit 28.5, platelets 167,000.   IMPRESSION: Dakota Wood will have percutaneous endoscopic gastrostomy tube placement tomorrow by Dr. Servando Snare due to decreased p.o. intake and dysphagia, status post cerebrovascular accident. I have discussed risks and benefits of the procedure, which include, but are not limited to, bleeding, infection, perforation, and drug reaction. I have discussed percutaneous endoscopic gastrostomy tube and the fact that it will not decrease the risk of aspiration.  I also discussed risks of infection with the percutaneous endoscopic gastrostomy tube as well as to the skin, complications with the percutaneous endoscopic gastrostomy tube including the fact that it may become dislodged. They are concerned that he may pull at the percutaneous endoscopic gastrostomy tube and we discussed use of abdominal binder for this.   PLAN: 1.  Ancef on call to the OR. 2.  Heparin will be on hold after midnight for the procedure.  3.  NPO after midnight.   These services provided by Joselyn Arrow, NP, under collaborative agreement with  Midge Minium, MD.   Thank you for allowing [place] to participate in his care.    ____________________________ Joselyn Arrow, NP klj:LT D: 09/06/2014 19:58:00 ET T: 09/06/2014 20:27:17 ET JOB#: [id number]  cc: Joselyn Arrow, NP, <Dictator> Lynnea Ferrier, MD    Joselyn Arrow FNP ELECTRONICALLY SIGNED 09/17/2014 12:49

## 2014-10-31 NOTE — Discharge Summary (Signed)
PATIENT NAME:  Dakota Wood, Dakota Wood MR#:  [id number] DATE OF BIRTH:  05-03-1929  DATE OF ADMISSION:  08/25/2014 DATE OF DISCHARGE:  09/10/2014  Van ClinesJon Arwyn Besaw, PA, dictating for  Illene LabradorJames P. Angie FavaHooten Jr., M.D.   ADMITTING DIAGNOSIS: Degenerative arthrosis of left hip.   DISCHARGE DIAGNOSES: 1. Degenerative left hip disease. 2. Hypotension/acute on chronic left-sided systolic congestive heart failure/acute renal failure.  3. Peripheral vascular disease/acute cerebrovascular accident.  4. Chronic obstructive pulmonary disease/acute on chronic respiratory failure, combination of congestive heart failure with acute obstructive pulmonary disease. 5. Urinary tract infection.  6. Left upper extremity deep venous thrombosis. 7. Urinary tract infection. 8. Chest pains, etiology unknown, felt to possibly be gastric.   CONSULTATIONS:  1. Dr. Daniel NonesBert Klein, internist.  2. Dr. Cherylann RatelLateef, nephrologist.  3. Dr. Loretha BrasilZeylikman, neurologist.  4. Dr. Meredeth IdeFleming, pulmonary.  5. Dr. Lorre NickGittin, oncology.  6. Dr. Lauro FranklinNancy Pfeifer, palliative care.  7. Richardo HanksJanice Jones, nurse practitioner, for Dr. Servando SnareWohl, gastroenterology. 8. Dr. Egbert GaribaldiBird, surgeon, for placement of PICC line.   HISTORY OF PRESENT ILLNESS: The patient is an 81 year old gentleman who has been [facility] at [facility] for progression of left hip pain. The patient states that he had a long history of progressive bilateral hip pain with the left being more symptomatic than the right. He had been advised not to use nonsteroidal anti-inflammatory medications due to his anticoagulation with Plavix. He reports increase in his left hip pain and groin pain with weight-bearing activities. He had also appreciated a progressive decrease in his range of motion of the left hip. At the time of surgery, he had gone to using a wheelchair because of the severity of pain and limitation of activities; however, around the home, he was not using any ambulatory aid. The patient did not see any significant  improvement in his discomfort despite the intra-articular cortisone injections. He denied any radiation of pain down the legs. He denied any bowel or bladder dysfunction. The pain had progressed to the point that it was significantly interfering with his activities of daily living. [facility] taken in [facility] showed significant narrowing of the cartilage space with bone-on-bone articulation being noted. There was subchondral sclerosis, as well as osteophyte formation noted. Vascular calcification was noted bilaterally. After discussion of the risks and benefits of surgical intervention, the patient expressed his understanding of the risks and benefits and agreed for plans for surgical intervention.   PROCEDURE: Left total hip arthroplasty.   ANESTHESIA: Spinal.   IMPLANTS UTILIZED: DePuy 13.5 mm small stature AML femoral stem, 54 mm outer diameter Pinnacle 100 acetabular component (cemented), +4 mm neutral Pinnacle Marathon polyethylene liner, and a 36 mm outer diameter M-Spec femoral head with a +1.5 mm neck length.   HOSPITAL COURSE: The patient tolerated the procedure very well. He had no complications. He was then taken to the PACU where he was stabilized and then transferred to the orthopedic floor. Upon being received on the orthopedic floor, the patient was noted be slightly bradycardic and had decreased urine output and decreased blood pressure. He was given 2 boluses of fluid, which improved his condition. There were no chest pains or shortness of breath noted at that time. He did return to what was felt to be a stable baseline level. Pain was well controlled. The patient was fitted with TED stockings bilaterally. These were allowed to be removed 1 hour per 8 hour shift. The patient was also fitted with AVI compression foot pumps bilaterally set at 80 mmHg. His calves  were nontender and free of any evidence of any DVTs to the lower extremities. Negative Homans sign. Heels were elevated off  the bed using rolled towels. The patient was started on Lovenox 30 mg subcutaneously q.12 h. per anesthesia and pharmacy protocol.   The patient had denied any chest pain or shortness of breath. His vital signs were initially stable. He was afebrile. Hemodynamically he was stable and no transfusions were given.   Postoperative day 2, medical consult was obtained in the afternoon secondary to the patient becoming hypotensive with chronic left-sided systolic congestive heart failure. He was noted to have COPD with chronic respiratory failure. He subsequently was taken to the CCU. Also on day 2, there was some evidence of possible left-sided weakness and felt to have a stroke. An MRI of the head was performed, which, at the time, did not show any neurogenic bleeding. He was then switched to heparin, as well as aspirin, and the Lovenox was discontinued.   Postoperative day 3, Dr. Egbert Garibaldi was contacted for placement of a left EJ 16-gauge. He was noted to have acute respiratory failure. A chest x-ray did confirm some mild pulmonary edema plus possible effusion. Echocardiogram was performed, which did not show any vegetation. He was also felt to possibly have a UTI and subsequently was started on a 2-week course of antibiotics. Due to his urine output being decreased, the nephrologist was consulted. He was felt to be in acute renal failure secondary to ATN and a renal ultrasound was performed.   Day 4, the patient did develop left-sided weakness and a repeat CT was performed, which did show a large acute/subacute non-hemorrhagic right MCA territorial infarct. Subsequently the neurologist was consulted.   Postoperative day 5, pulmonary consult was obtained. Carotid ultrasounds were performed, which did not reveal any obstructive condition.   On postoperative day 6, the patient was transferred back to the orthopedic floor from the CCU.   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 He was noted to have anemia secondary to surgery, as well as renal failure. The patient did begin receiving physical therapy. Following surgery, while he was in the CCU except for some general range of motion of the lower extremity. The patient was noted to have some swelling to the left upper extremity while in the CCU. An ultrasound of this did show what was felt to be a chronic subacute clot. The arm was elevated and heat was applied. The swelling significantly improved. He was noted to have complete weakness to the left upper extremity and lower extremity. Was having difficulty with his speech. He was also very confused throughout the hospital course. Upon being discharged, however, some this has improved. He was improving in strength of the lower extremity. The left leg, he was able to flex greater than 45 degrees passively. Still had no use of the left upper extremity.   On postoperative day number 10, the patient did develop some chest pains and subsequently EKG was performed for which the laboratories with troponins were felt to be within normal limits. There were no changes noted on the EKG to suggest a cardiac issue. Subsequently, it was felt that this may be secondary to a GI issue. Chest x-ray was also performed, which once again showed bilateral pleural effusions.   The patient was not eating, and, subsequently on postoperative day 12, a PEG  was performed by Dr. Egbert Garibaldi endoscopically. There were no complications  with the patient and the PEG, as far as eating. He had no complications and was able to tolerate the tube well.   The day prior to being discharged to rehabilitation, the patient was able to sit up in a chair for about an hour and a half with the assistance of PT. The patient has tried very hard to improve. He has made some improvement, but is, for the most part, stable.   The patient is being discharged to a rehabilitation facility in improved stable  condition.   INSTRUCTIONS: He will continue with PT for transfers, range of motion, and strengthening and conditioning of the upper and lower extremities, stroke protocol. Posterior hip precautions are recommended. Elevate the lower extremities. Continue elevating the feet off the bed using rolled towels. Continue TED stockings bilaterally. These are allowed to be removed 1 hour per 8 hour shift. Incentive spirometer q.1 h. while awake. Encourage cough, deep breathing q.2 h. while awake. He is placed on a diabetic diet. Staples have been removed. Will change the dressing as needed.   He will need to be seen in the clinic in 4 weeks with Dr. Ernest Pine. This appointment should already have been made. The patient may take a shower. Continue PT for range of motion and ambulation. OT for ADLs and assistive devices.   DRUG ALLERGIES: CODEINE, LIPITOR, PLAQUENIL, SULFATE, TRAMADOL, TAPE.   MEDICATIONS:  1. Refresh eyedrops 2 drops to both eyes b.i.d.  2. Vitamin D3 2000 units daily. 3. Vitamin B12 500 mcg daily.  4. Senokot-S 1 tablet b.i.d. 5. Ferrous sulfate 325 mg daily. 6. Gabapentin 200 mg at bedtime.  7. Synthroid 0.05 mg q.6 a.m. 8. Magnesium oxide 400 mg daily.  9. Multivitamin capsule 1 tablet daily.  10. Nitroglycerin patch p.r.n. 6 p.m.  11. Protonix suspension 40 mg b.i.d.  12. Zocor 20 mg at bedtime. 13. Aspirin 81 mg daily. 14. Coumadin 3 mg q. 5 p.m. 15. Glucotrol 5 mg b.i.d. before meals.  16. Metformin 500 mg b.i.d. with meals.  17. Advair Diskus 250/50 inhaler 1 puff inhalation b.i.d. 18. Ensure, usual schedule. 19. Tylenol ES 500-1,000 mg q.4 h. p.r.n. 20. Mylanta DS 30 mL q.6 h. p.r.n.  21. Dulcolax suppository 10 mg rectally p.r.n.  22. Nitroglycerin 4 mg sublingual q. 5 minutes p.r.n. for chest pains.  23. Enema soapsuds if no results and milk of magnesia or Dulcolax p.r.n.   PAST MEDICAL HISTORY: Coronary artery disease, hyperlipidemia, gastritis with hemorrhage,  peripheral vascular disease, chronic obstructive pulmonary disease, hypertension, B12 deficiency, diabetes mellitus, complete heart block, renal artery stenosis, pulmonary fibrosis, Barrett esophagus with esophagitis, respiratory failure, shingles, cataracts.    ____________________________ Van Clines, PA [id number] D: 09/10/2014 12:36:55 ET T: 09/10/2014 13:00:19 ET JOB#: [id number]  cc: Van Clines, PA, <Dictator> Kaamil Morefield PA ELECTRONICALLY SIGNED 09/21/2014 21:03

## 2014-10-31 NOTE — Consult Note (Signed)
PATIENT NAME:  Dakota Wood, LEVERICH MR#:  [id number] DATE OF BIRTH:  02-Oct-1928  DATE OF CONSULTATION:  09/03/2014  REFERRING PHYSICIAN:   CONSULTING PHYSICIAN:  Simonne Come. Inez Pilgrim, MD  Dakota Wood is an 81 year old man who was admitted on February 24. I saw and evaluated him, met him on March 2 and fully evaluated him on March 3, although this narrative is delayed until today. Also the same day, today, a followup note is placed in the written chart.   Dakota Wood is an 81 year old man and his history has been thoroughly reviewed by other providers. He has underlying diabetes, COPD, congestive heart failure. He has coronary artery disease. He has had CABG with complete heart block and pacemaker, hyperlipidemia, arthritis, B12 deficiency, gastritis, renal artery stenosis, peripheral vascular disease, prior renal artery angioplasties. He also has a documented splenomegaly on a September 2015. CT scan. On this background, he came for elective hip surgery on February 24. Postoperatively he was hypotensive and medicine was asked to see him for acute renal failure. His creatinine jumped from 1.1 to 2.28. He gave IV hydration but developed pulmonary edema later. He was treated with respiratory support as well as dopamine. He was followed by medicine and by critical care physicians. In the workup of his acute renal failure, which was attributed to ATN from hypotension, abnormal serology was found, positive serum protein electrophoresis and very small amounts of urine light chains, so hematology was consulted. The patient has subsequently suffered an acute CVA affecting the left side, inability to move, causing increased lethargy. The patient, although lethargic, retained cognitive faculties, slowly appears to be making some progress. He has been lucid when awake. In addition to multiple evaluations, he was noted to have some edema in the left arm. A Doppler ultrasound was done. He has some deep vein thrombosis but it  appears to be a subacute or chronic clot on radiographic criteria. This has been noted while the patient is on prophylactic anticoagulation. He has not been placed on therapeutic anticoagulation, with the risk/benefit ratio considered of possibly CNS effects of full-dose anticoagulation versus the risk felt to be lower of propagation to peripheral clot or a PE from a small upper extremity clot. The patient is said to have a baseline mild anemia, likely is anemia of chronic disease. He did have a hemoglobin of 11.1 prior to surgery with multiple illnesses as stated. He has had blood loss anemia and leukocytosis since.   Hematology is asked to see the patient and comment on the IgM monoclonal gammopathy, the scant light chains in the urine, the small clot in the arm. It is also noted that he has lymphocytosis, and I have also seen in the chart documentation in the past of splenomegaly back in September 2015.   The patient's additional surgeries. He has had bilateral renal artery angioplasties. He has had a right carotid endarterectomy. He has had cataract surgery, lumbar spine surgery, and pacemaker.   SOCIAL HISTORY: No alcohol or tobacco.   FAMILY HISTORY: Reported negative for malignancy.   ALLERGIES: LISTED AS CODEINE, LIPITOR, PLAQUENIL, TRAMADOL AND TAPE.  On admission, he was on multiple medications, have been noted by admission note, other providers at that time. Plus he had been given Ancef 2 grams IV but regular medicines included magnesium for heartburn, Norvasc 10 mg b.i.d., ?? 50 mg a day, Dulcolax p.r.n., Refresh ophthalmic drops 2 drops twice a day for both eyes, vitamin D 2000 units daily, cyanocobalamin 500 mcg p.o. daily, ?  1 tablet b.i.d., folic acid 1 mg daily, 200 mg at bedtime, glipizide 5 mg b.i.d., lisinopril 40 mg daily, metformin 500 mg b.i.d., Reglan 10 mg q.i.d. He was on morphine after the surgery, multivitamins daily, pantoprazole 40 mg b.i.d., Roxicodone 5 mg q. 4 hours,  Zocor 20 mg at night, trazodone 50 mg at night, vitamin E 400 units daily, and he had been on Lovenox 30 mg subcutaneous q. 12 hours later and, with renal failure, that was changed to heparin 500 q. 8 hours.   On review of systems, when I saw the patient he was too lethargic. In review of systems, he could answer questions when aroused but he would fall back asleep. He denied that he had chest pain or that he was short of breath. He did endorse hip pain. He prior was noted not to have focal weakness or chest pain, GI or respiratory acute issues. He has a history of COPD. He had no prior history of seizure or stroke. He has diabetes as stated. No history of easy bleeding or bruising.   PHYSICAL EXAMINATION:  VITAL SIGNS: When I saw the patient, his vital signs were stable. GENERAL: He was on oxygen and looked chronically, subacutely, and acutely ill but was not in acute distress. HEENT: He was not jaundiced. There was no thrush in the mouth. LUNGS: Air entry was decreased bilaterally. He was not wheezing.  ABDOMEN: Nontender. No palpable mass.  HEART: Regular. SKIN: Did not inspect the wound on the left. EXTREMITIES: He had some edema on the left arm.   LABORATORY RESULTS: Starting with review of old results, back in August 2013 patient had hemoglobin of 11.8, a leukocytosis but an infection at that time. In August 2013, hemoglobin 11.2 and normal lymphocytes. In August 2013, CT chest showed some interstitial lung fibrosis. In December 2013, there was mild hepatomegaly, no splenomegaly reported. In October 2014, the hemoglobin is 10.4 and there is a mild lymphocytosis of 5300. In October 2014, hemoglobin is 9.8. There is a mild lymphocytosis of 7200.  IMAGING STUDIES: There is a CT chest October 2014 that reveals the spleen is enlarged at 15 cm; that is in October 2014. Recently, on February 10, hemoglobin was 11.1 and platelets were 179,000. This hospitalization, as already stated, an SIEP shows a  200 mg monoclonal M spike. There is very scant trivial urine protein immunoelectrophoresis, positive for Bence-Jones proteins. February 24 the platelets were down to 134,000 and the hemoglobin drifted down to 8.4. ANA and serologies including ANCA and glomerular basement membrane antibodies were all normal as worked up by nephrology. The liver chemistries on March 2, SGPT is elevated at 81, SGOT is normal. Albumin is 2.6. The bilirubin is normal. The creatinine is 1.19 on that day. On March 2, hemoglobin was 9.1, white count 13.4, platelets 214,000, neutrophils, 6.5, lymphocytes 6.3. A Doppler ultrasound of the left arm has already been described. On March 4, the hemoglobin later dropped back down to 8.1.   IMPRESSION AND PLAN: From the hematology point of view, the patient has mild splenomegaly noted since at least 2013. At one point, mild hepatomegaly but no obvious cirrhosis or liver disease. He also has a mild lymphocytosis that goes back to at least a year ago. These could be reactive or spurious but also possibly consistent with a low-grade lymphoma. There is a monoclonal IgM that also might represent Waldenstrom disease or another low-grade lymphoma. It is not in any way impacting on the patient's acute difficulties. He  had mild anemia consistent with chronic disease that has dropped with blood loss. That should be followed in the future. Iron studies should be documented at some point. There is a subacute clot in the left arm, and the decision not to fully anticoagulate due to the risk to the CNS and a large acute stroke. This arm should be followed clinically and ultrasound should be repeated to rule out propagation of the clot. He is a candidate for prophylactic anticoagulation but he is, as stated, a poor candidate for full-dose anticoagulation.   In summary, from the hematology and oncology point of view, the patient should be watched with serial CBC, later iron studies, later a CLL profile to look  for evidence of lymphoma, later repeat noncontrast scans of the abdomen and pelvis to look at spleen size and adenopathy, plus watch the left arm and recommended repeat the ultrasound to rule out propagation of the clot. Hematology followup could certainly be an outpatient if the patient could make a recovery from his acute problems.     ____________________________ Simonne Come Inez Pilgrim, MD [id number] D: 09/03/2014 11:03:28 ET T: 09/03/2014 11:52:48 ET JOB#: [id number]  cc: Simonne Come. Inez Pilgrim, MD, <Dictator> Dallas Schimke MD ELECTRONICALLY SIGNED 09/07/2014 21:53

## 2014-10-31 NOTE — Consult Note (Signed)
Brief Consult Note: Diagnosis: MONOCLONAL GAMMOPATHY LOW LEVEL IGM, LYMPHOCYTOSIS, CHRONIC CLOT LUE, BLOOD LOSS ANEMIA, ANEMIA CHRONIC DISEASSE, SPLENOMEGALY.   Patient was seen by consultant.   Comments: SEE DICTATED NOTE. INITIALLY SEEN MARCH 2, F/U SEEN MARCH 3. MET WITH FAMILY MEMBERS.Marland KitchenBRIEFLY .Marland Kitchen 1. RE MONOCLONAL IGM, VERY LOW LEVEL, NOT CONTRIBUTING TO CURRENT ISSUES, NOT OF ACUTE SIGNIFICANCE. SOMETIMES ASSOCIATED WITH LYMPHOMA/WALDENSTROMS, NOTE HAS MILD LYMPHOCYTOSIS, AND HAS SPLENOMEGALY ON CT ABDO 03/2014, MAY BE LOW GRADE OR SPLENIC LYMPHOMA, THERE WAS NO ADENOPATHY AT THAT TIME   WILL NEED REPEAT SIEP IN FUTURE AND CLL PROFILE OF PERIPHERAL BLOOD , I HAVE ORDERED ... 2. SPLENOMEGALY 03/2014 BY CT   3. MILD LYMPHOCYTOSIS ALSO SUGGESTIVE OF LOW GRADE LYMPHOMA    4. RE ANEMIA, APPEARS TO BE ANEMIA OF CHRONIC DISEASE PRE OP, HGB WAS 11.1 PRIOR, THEN BLOOD LOSS ANEMIA, SOME REACTIVE LEUKOCYTOSIS, NO SUSPICION OF OTHER BLOOD DISORDER, WILL NEED SERIAL CBC, AND LATER IRON STUDIES  3. RE CLOT LUE, AS PER MEDICINE, HIGH RISK TO FULL ANTICOAGULATION. I DEFER THIS DECISION TO MEDICINE OR NEUROLOGY. AGREE THAT RISK OF PE WITH SMALL AND LIKELY CHRONIC CLOT IS LOW. AGREE WITH PROPHLACTIC HEPARIN.  I WOULD SUGGEST REPEAT U/S OF LEFT ARM TO R/O PROGRESSION OF CLOT., IF CANNGES THEN WOULD ASK VASCULAR SURGERY TO SEE. I WILL FOLLOW...   IN SUMMARY, F/U CBC, WATCH LEFT ARM, REPEAT U/S LEFT ARM, I WILL ORDER CLL PROFILE, LATER MAY NEED NON CONTRAST CT ABDO AND PELVIS TO EVALUATE SPLEEN SIZE AND FOR LYMPHADENOPATHY...  Electronic Signatures: Dallas Schimke (MD)  (Signed 04-Mar-16 10:41)  Authored: Brief Consult Note   Last Updated: 04-Mar-16 10:41 by Dallas Schimke (MD)

## 2014-10-31 NOTE — Consult Note (Signed)
   Comments   Spoke with Drs Ernest PineHooten and Graciela HusbandsKlein. Will hold on consult for now. Please reconsult if we can be of assistance.   Electronic Signatures: Borders, Daryl EasternJoshua R (NP)  (Signed 29-Feb-16 09:23)  Authored: Palliative Care   Last Updated: 29-Feb-16 09:23 by Malachy MoanBorders, Joshua R (NP)

## 2014-10-31 NOTE — Op Note (Signed)
PATIENT NAME:  Dakota Wood, Dakota Wood DATE OF BIRTH:  1928-09-03  DATE OF PROCEDURE:  08/25/2014  PREOPERATIVE DIAGNOSIS: Degenerative arthrosis of the left hip.   POSTOPERATIVE DIAGNOSIS: Degenerative arthrosis of the left hip.   PROCEDURE PERFORMED: Left total hip arthroplasty.   SURGEON: Francesco SorJames Hooten, M.D.   ASSISTANT: Van ClinesJon Wolfe, PA (required to maintain retraction throughout the procedure).   ANESTHESIA: Spinal.   ESTIMATED BLOOD LOSS: 200 mL.   FLUIDS REPLACED: 1700 mL of crystalloid.   DRAINS: Two medium drains to Hemovac reservoir.   IMPLANTS UTILIZED: DePuy 13.5 mm small stature AML femoral stem, 54 mm outer diameter Pinnacle 100 acetabular component (cemented), +4 mm neutral Pinnacle Marathon polyethylene liner, and a 36 mm outer diameter M-SPEC femoral head with a +1.5 mm neck length.   INDICATIONS FOR SURGERY: The patient is an 81 year old male who has been seen for complaints of progressive left hip and groin pain. X-rays demonstrated severe degenerative changes with near full-thickness loss of the cartilage space. After discussion of the risks and benefits of surgical intervention, the patient expressed understanding of the risks, benefits, and agreed with plans for surgical intervention.   PROCEDURE IN DETAIL: The patient was brought into the operating room and, after adequate spinal anesthesia was achieved, the patient was placed in a right lateral decubitus position. Axillary roll was placed, and all bony prominences were well padded. The patient's left hip and leg were cleaned and prepped with alcohol and DuraPrep draped in the usual sterile fashion. A "timeout" was performed as per usual protocol.   A lateral curvilinear incision was made gently curving towards the posterior superior iliac spine. IT band was incised in line with the skin incision and the fibers of the gluteus maximus were split in line. Dissection was carried down to the piriformis tendon and  the tendon was incised and reflected posteriorly. In a similar fashion, short external rotators were incised and reflected posteriorly. A T-type posterior capsulotomy was performed. Prior to dislocation of the femoral head, a threaded Steinmann pin was inserted through a separate stab incision into the pelvis superior to the acetabulum and bent in the form of a stylus so as to assess limb length and hip offset throughout the procedure.   The femoral head was then dislocated posteriorly. Inspection of the head demonstrated a full-thickness loss of articular cartilage most notably to the superior aspect. A femoral neck cut was performed using an oscillating saw. The anterior capsule was elevated off the femoral neck. Inspection of the acetabulum also demonstrated significant degenerative changes. Remnant of the labrum was excised. The acetabulum was reamed in a sequential fashion up to a 53 mm diameter. A 54 mm outer diameter Pinnacle 100 acetabular component was positioned and impacted into place. Excellent scratch fit was appreciated. A +4 mm neutral polyethylene trial was inserted and attention was directed to proximal femur. Pilot hole for reaming the proximal femoral canal was created using a high-speed burr. The proximal femoral canal was reamed in a sequential fashion up to a 13 mm diameter. This allowed for over 6 cm of scratch fit. The proximal femur was then prepared using a 13.5 mm aggressive side-biting reamer. Serial broaches were inserted up to a 13.5 mm small stature.   The calcar region was planed and trial reduction was performed with a 36 mm hip ball with a +1.5 mm neck length. This allowed for excellent stability both anteriorly and posteriorly as well as good hip offset and equalization of limb  lengths. Trial components were removed. The acetabular shell was irrigated with copious amounts of normal saline with antibiotic solution and then suctioned dry. A +4 mm neutral Pinnacle Marathon  polyethylene liner was positioned and impacted into place.   Next, the femoral component was provisionally seated. At this time, greater than 6 cm of scratch fit was appreciated. It was thus elected to hand ream line to line to a 13.5 mm. The 13.5 mm small stature AML femoral component was again positioned and impacted into place. Excellent scratch fit was appreciated. Trial reduction was again performed with a 36 mm hip ball with a +1.5 mm neck length. Again, excellent stability both anteriorly and posteriorly was appreciated and good hip offset and equalization of limb lengths was noted. The trial hip ball was removed. The Morse taper was cleaned and dried. A 36 mm M-SPEC femoral head with a +1.5 mm neck length was placed on the trunnion and impacted into place. The hip was reduced and place through a range of motion with excellent stability again noted.   The wound was irrigated with copious amounts of normal saline with antibiotic solution using pulsatile lavage and then suctioned dry. Good hemostasis was appreciated. The posterior capsulotomy was repaired using #5 Ethibond. The piriformis tendon was reapproximated on the surface of the gluteus medius tendon using #5 Ethibond. Two medium drains were placed in the wound bed and brought out through a separate stab incision to be attached to a Hemovac reservoir. IT band was repaired using interrupted sutures of #1 Vicryl. The subcutaneous tissue was approximated in layers using first 0 Vicryl followed by 2-0 Vicryl. Skin was closed with skin staples. A sterile dressing was applied.   The patient tolerated the procedure well. He was transported to the recovery room in stable condition.   ____________________________ Illene Labrador. Angie Fava., MD jph:mc D: 08/26/2014 06:15:40 ET T: 08/26/2014 13:17:56 ET JOB#: [id number]  cc: Fayrene Fearing P. Angie Fava., MD, <Dictator> Illene Labrador Angie Fava MD ELECTRONICALLY SIGNED 09/21/2014 16:48

## 2014-11-09 ENCOUNTER — Encounter: Payer: Self-pay | Admitting: Cardiology

## 2014-11-22 ENCOUNTER — Telehealth: Payer: Self-pay | Admitting: Internal Medicine

## 2014-11-22 NOTE — Telephone Encounter (Signed)
New Message  Pt son called states that the pt expired in March. Still has remote device req a call back to discuss how to discard of the the Device mail or fax back//sr

## 2014-11-22 NOTE — Telephone Encounter (Signed)
Re-Routing this message to the Device clinic to call and assist the patient

## 2014-11-22 NOTE — Telephone Encounter (Signed)
Spoke w/ pt son and ordered him a return kit to send monitor back to [employer].

## 2014-12-14 ENCOUNTER — Encounter: Payer: Self-pay | Admitting: Cardiology

## 2016-04-16 IMAGING — CR DG CHEST 1V PORT
1 series · 1 of 1 positions shown · non-contrast
Comparison: Chest radiograph yesterday.

CLINICAL DATA: Increasing lethargy and shortness of breath.
Decreased O2 sats.

EXAM:
PORTABLE CHEST - 1 VIEW

[ap]
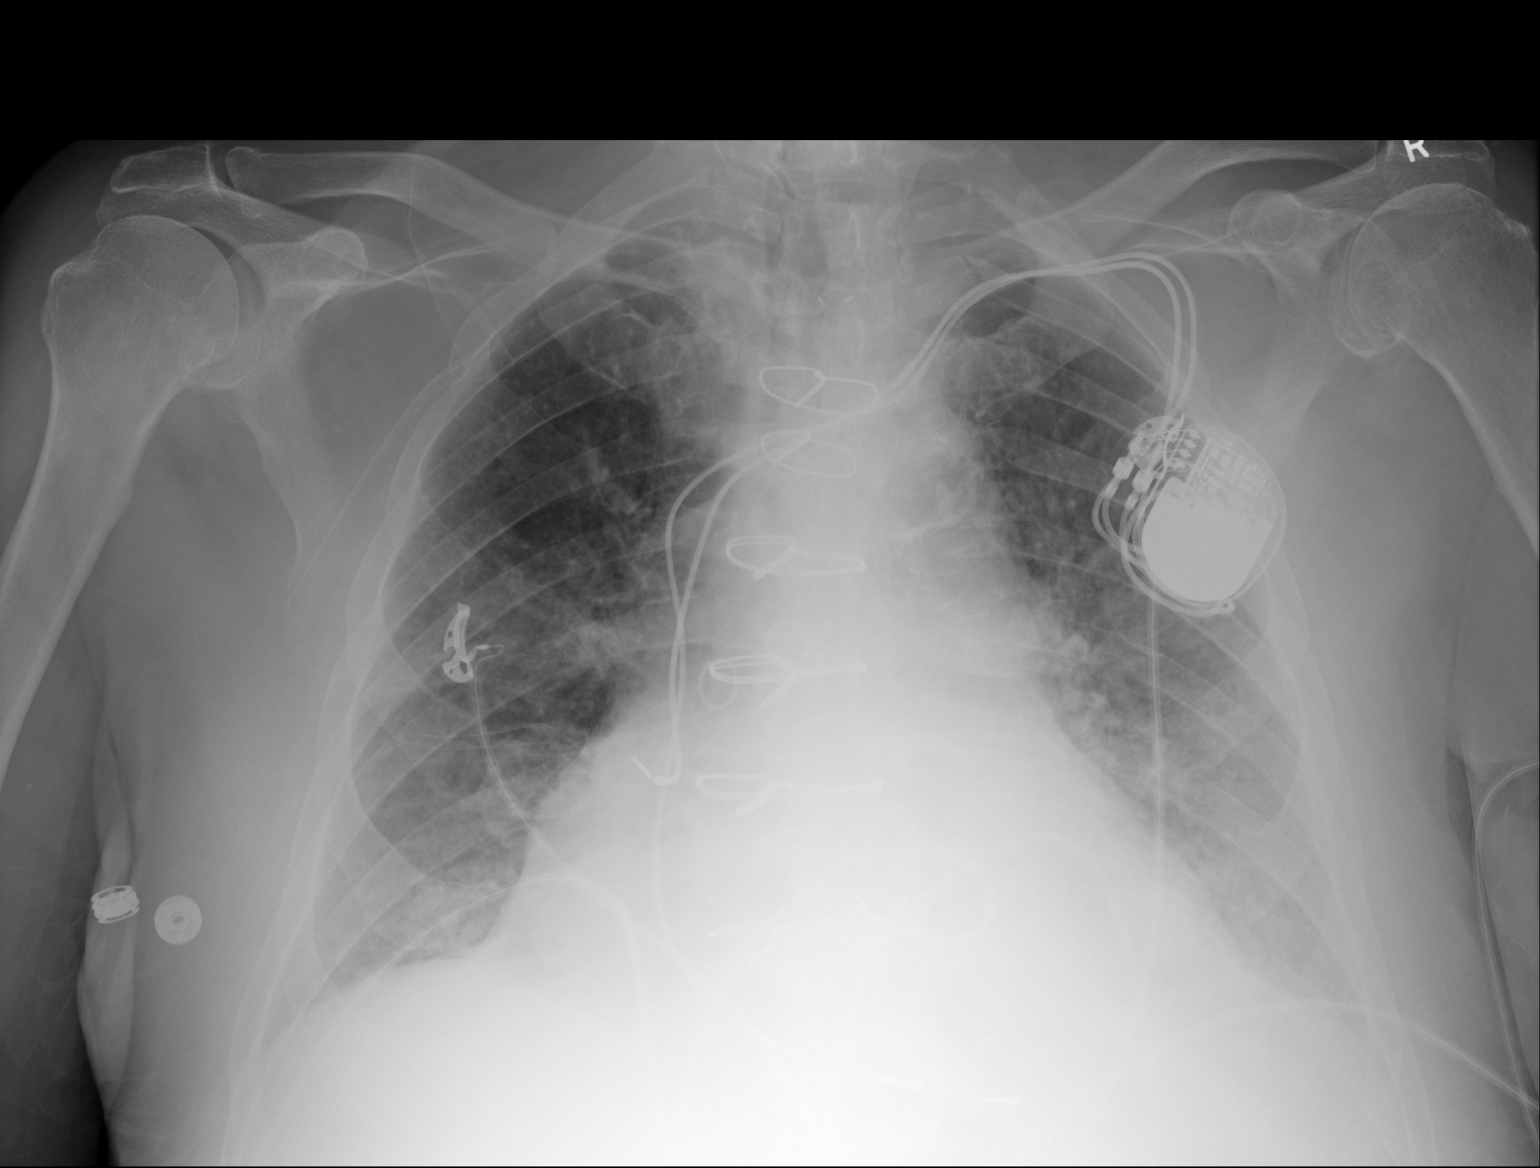

[1 of 1 positions shown; findings below may reference images not displayed]

FINDINGS: Dual lead left-sided pacemaker remains in place. Patient is post
median sternotomy Cardiomegaly is stable. Development of
peribronchial cuffing. Ill-defined left basilar opacity, question
increased from prior versus differences in technique. No new
airspace disease.
IMPRESSION: Development of peribronchial cuffing, may reflect pulmonary edema.
Question increased left basilar opacity versus differences in
technique. This may reflect increasing atelectasis versus pleural
effusion.
